# Patient Record
Sex: Female | Born: 1950
Health system: Southern US, Community
[De-identification: ages and names within clinical notes are randomized; demographics above are authoritative.]

## PROBLEM LIST (undated history)

## (undated) DIAGNOSIS — C801 Malignant (primary) neoplasm, unspecified: Secondary | ICD-10-CM

## (undated) DIAGNOSIS — E782 Mixed hyperlipidemia: Secondary | ICD-10-CM

## (undated) HISTORY — DX: Malignant (primary) neoplasm, unspecified: C80.1

## (undated) HISTORY — DX: Mixed hyperlipidemia: E78.2

## (undated) HISTORY — PX: TONSILLECTOMY: SUR1361

---

## 2008-10-29 DIAGNOSIS — C801 Malignant (primary) neoplasm, unspecified: Secondary | ICD-10-CM

## 2008-10-29 HISTORY — PX: BREAST LUMPECTOMY: SHX2

## 2008-10-29 HISTORY — DX: Malignant (primary) neoplasm, unspecified: C80.1

## 2011-02-26 ENCOUNTER — Emergency Department: Payer: Self-pay | Admitting: Emergency Medicine

## 2011-04-03 ENCOUNTER — Ambulatory Visit: Payer: Self-pay | Admitting: Gastroenterology

## 2011-04-05 LAB — PATHOLOGY REPORT

## 2011-10-30 HISTORY — PX: COLONOSCOPY: SHX174

## 2011-10-30 LAB — HM COLONOSCOPY

## 2011-12-11 IMAGING — US ABDOMEN ULTRASOUND LIMITED
1 series · 17 of 25 positions shown · non-contrast
Comparison: none

REASON FOR EXAM: RUQ/epigastric pain and vomiting
COMMENTS:

PROCEDURE:     US  - US ABDOMEN LIMITED SURVEY  - February 27, 2011  [DATE]
RESULT:

[Series 1: abdomen ultrasound limited · 17 of 50 slices shown]
[im 1/50]
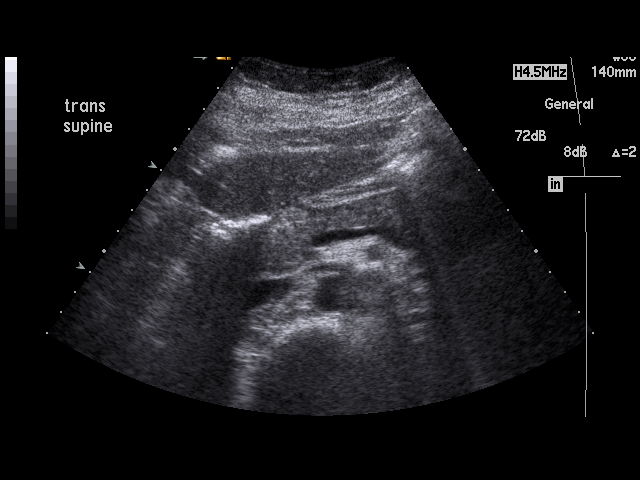
[im 5/50]
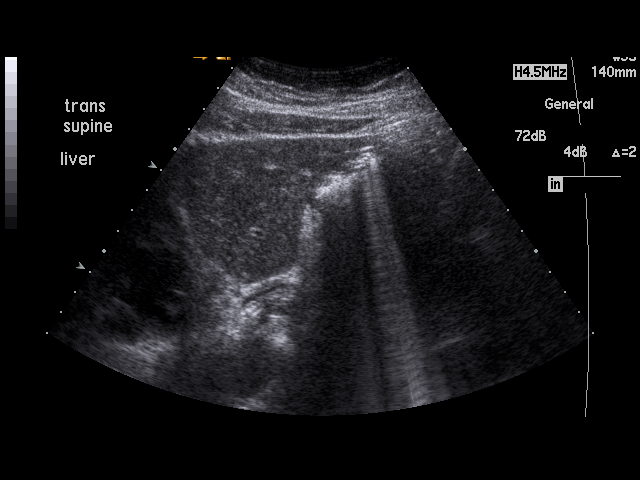
[im 7/50]
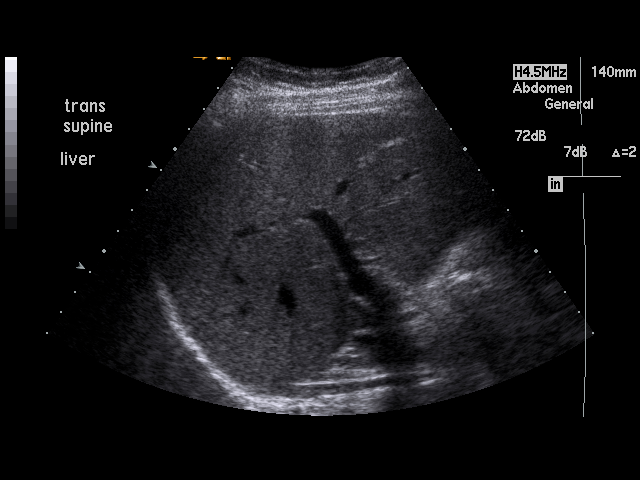
[im 11/50]
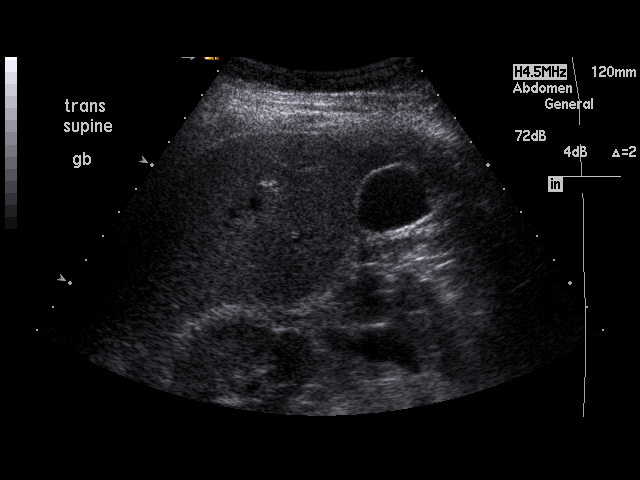
[im 13/50]
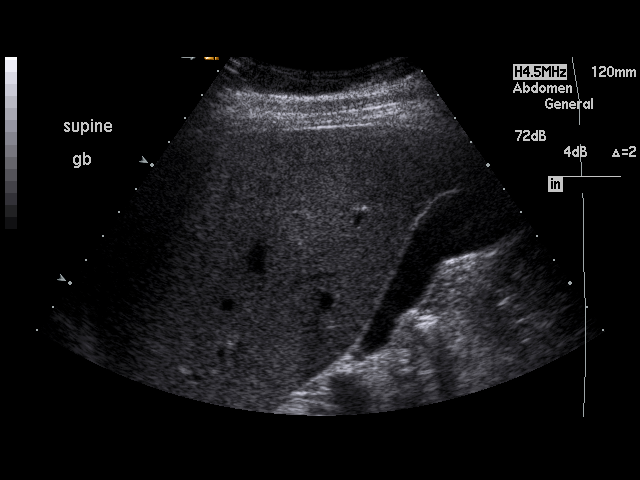
[im 17/50]
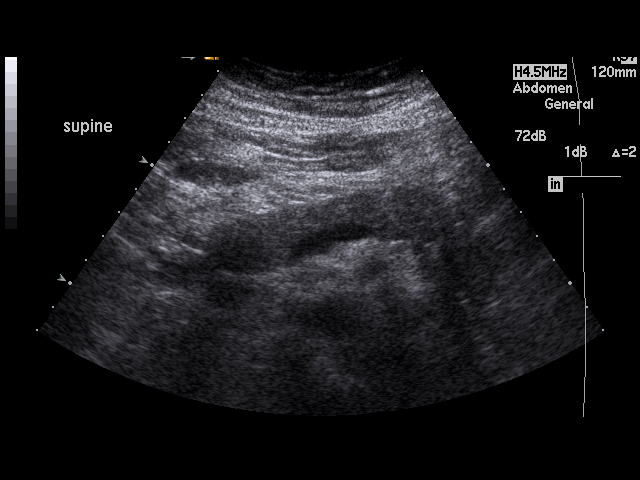
[im 19/50]
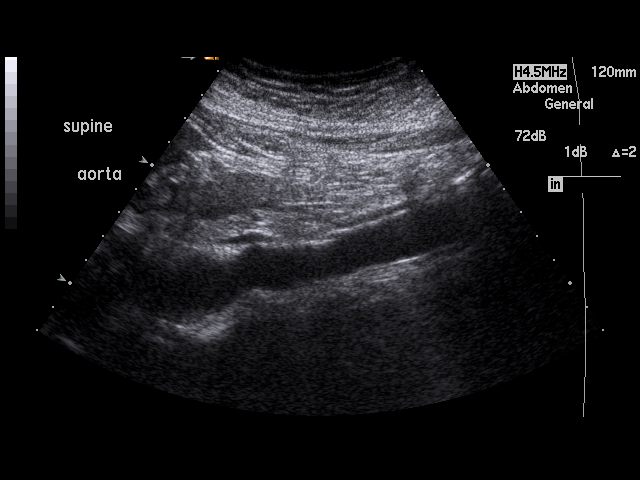
[im 23/50]
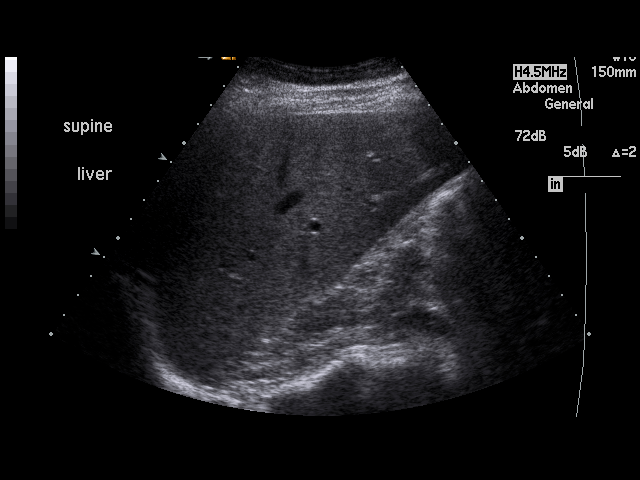
[im 25/50]
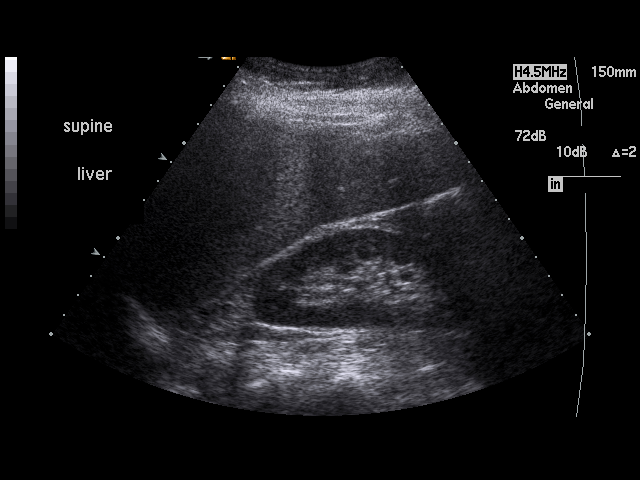
[im 27/50]
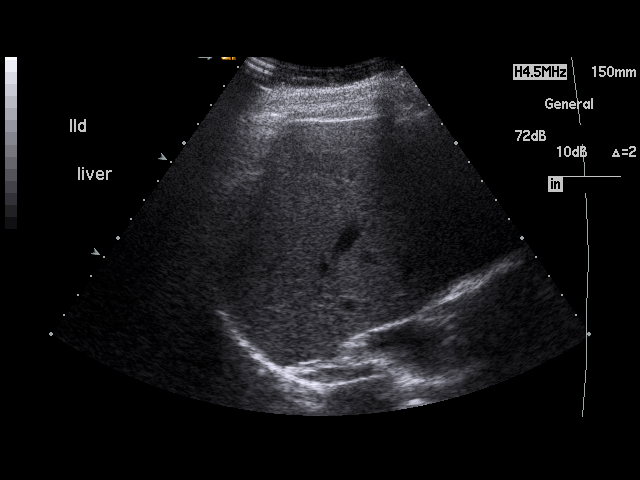
[im 31/50]
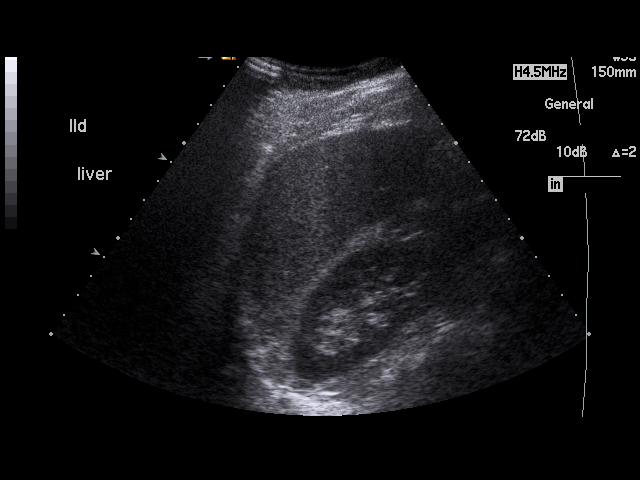
[im 33/50]
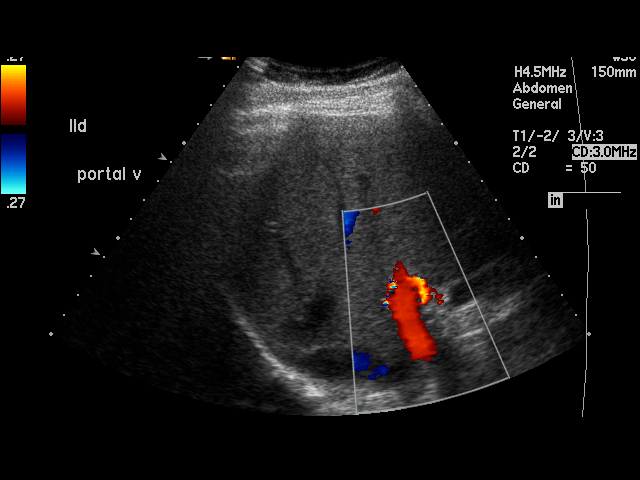
[im 37/50]
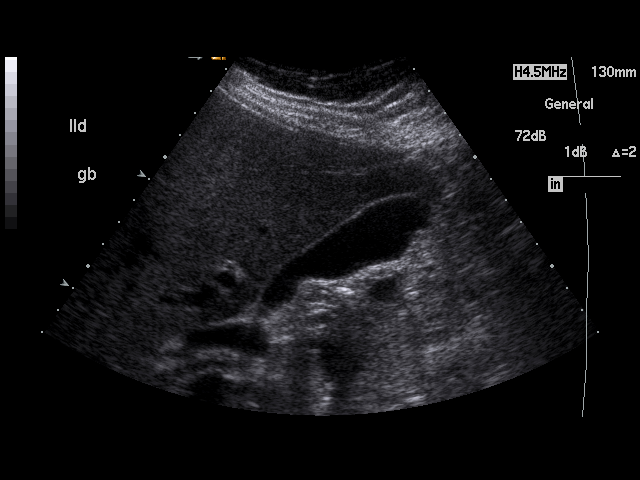
[im 39/50]
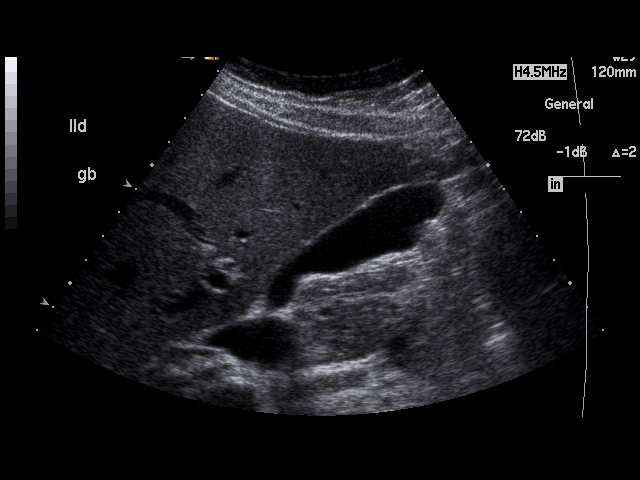
[im 43/50]
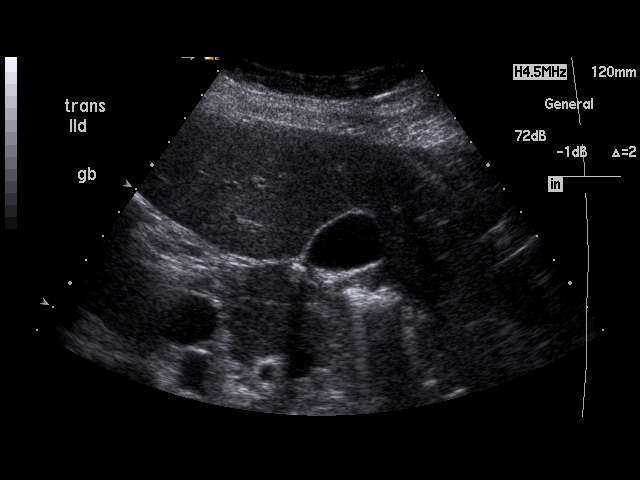
[im 45/50]
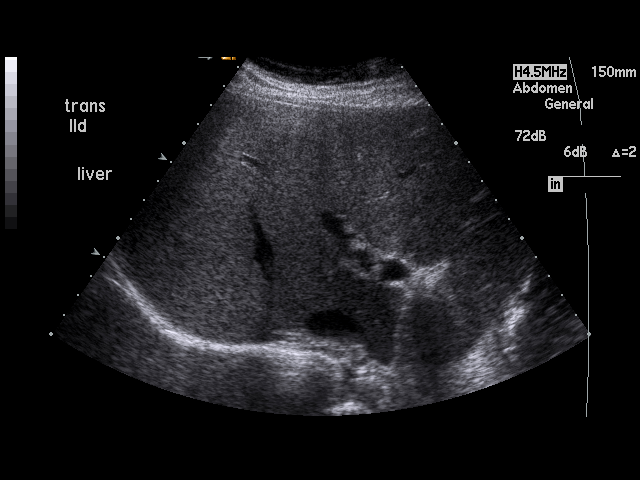
[im 50/50]
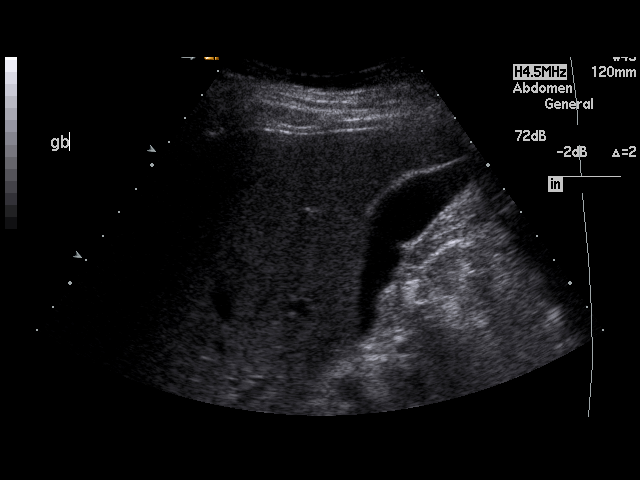

[17 of 25 positions shown; findings below may reference images not displayed]

FINDINGS: The visualized portions of the liver demonstrate a homogeneous
echotexture. The gallbladder fossa demonstrates no evidence of
pericholecystic fluid, gallstones or sludging. Gallbladder wall thickness is
1.9 mm and the common bile duct 4.1 mm in diameter. There is no evidence of
a sonographic Murphy's sign. There is no evidence of intrahepatic or
extrahepatic biliary duct dilatation or gallbladder wall thickening. Limited
evaluation of the pancreas, head of the pancreas is unremarkable.

Hepatopetal flow is identified within the portal vein. There is no evidence
of ascites.
IMPRESSION: 1. Unremarkable limited right upper quadrant ultrasound.

Dr. Nomasibulele of the Emergency Department was informed of these findings via a
preliminary faxed report.

## 2012-02-18 ENCOUNTER — Other Ambulatory Visit: Payer: Self-pay | Admitting: Family Medicine

## 2014-10-07 LAB — HM PAP SMEAR: HM Pap smear: NEGATIVE

## 2014-11-23 LAB — HM HEPATITIS C SCREENING LAB: HM Hepatitis Screen: NEGATIVE

## 2016-10-08 LAB — HM MAMMOGRAPHY

## 2016-12-12 ENCOUNTER — Ambulatory Visit (INDEPENDENT_AMBULATORY_CARE_PROVIDER_SITE_OTHER): Payer: Self-pay

## 2016-12-12 ENCOUNTER — Ambulatory Visit
Admission: EM | Admit: 2016-12-12 | Discharge: 2016-12-12 | Disposition: A | Discharge status: Home / Self Care | Payer: Self-pay | Attending: Family Medicine | Admitting: Family Medicine

## 2016-12-12 DIAGNOSIS — R55 Syncope and collapse: Secondary | ICD-10-CM

## 2016-12-12 DIAGNOSIS — R69 Illness, unspecified: Secondary | ICD-10-CM

## 2016-12-12 DIAGNOSIS — Z79899 Other long term (current) drug therapy: Secondary | ICD-10-CM | POA: Insufficient documentation

## 2016-12-12 DIAGNOSIS — R42 Dizziness and giddiness: Secondary | ICD-10-CM | POA: Insufficient documentation

## 2016-12-12 DIAGNOSIS — J111 Influenza due to unidentified influenza virus with other respiratory manifestations: Secondary | ICD-10-CM | POA: Insufficient documentation

## 2016-12-12 LAB — BASIC METABOLIC PANEL
ANION GAP: 8 (ref 5–15)
BUN: 8 mg/dL (ref 6–20)
CHLORIDE: 98 mmol/L — AB (ref 101–111)
CO2: 26 mmol/L (ref 22–32)
CREATININE: 0.74 mg/dL (ref 0.44–1.00)
Calcium: 8.9 mg/dL (ref 8.9–10.3)
GFR calc non Af Amer: >60 mL/min (ref >60)
Glucose, Bld: 113 mg/dL — ABNORMAL HIGH (ref 65–99)
POTASSIUM: 3.3 mmol/L — AB (ref 3.5–5.1)
SODIUM: 132 mmol/L — AB (ref 135–145)

## 2016-12-12 LAB — URINALYSIS, COMPLETE (UACMP) WITH MICROSCOPIC
BILIRUBIN URINE: NEGATIVE
Bacteria, UA: NONE SEEN
Glucose, UA: NEGATIVE mg/dL
HGB URINE DIPSTICK: NEGATIVE
Ketones, ur: NEGATIVE mg/dL
LEUKOCYTES UA: NEGATIVE
NITRITE: NEGATIVE
Protein, ur: NEGATIVE mg/dL
pH: 5.5 (ref 5.0–8.0)

## 2016-12-12 LAB — CBC WITH DIFFERENTIAL/PLATELET
BASOS ABS: 0 10*3/uL (ref 0–0.1)
BASOS PCT: 1 %
EOS ABS: 0 10*3/uL (ref 0–0.7)
Eosinophils Relative: 0 %
HEMATOCRIT: 41.3 % (ref 35.0–47.0)
HEMOGLOBIN: 13.9 g/dL (ref 12.0–16.0)
Lymphocytes Relative: 26 %
Lymphs Abs: 0.8 10*3/uL — ABNORMAL LOW (ref 1.0–3.6)
MCH: 28.8 pg (ref 26.0–34.0)
MCHC: 33.6 g/dL (ref 32.0–36.0)
MCV: 85.8 fL (ref 80.0–100.0)
Monocytes Absolute: 0.2 10*3/uL (ref 0.2–0.9)
Monocytes Relative: 7 %
NEUTROS PCT: 66 %
Neutro Abs: 2 10*3/uL (ref 1.4–6.5)
Platelets: 223 10*3/uL (ref 150–440)
RBC: 4.82 MIL/uL (ref 3.80–5.20)
RDW: 12.7 % (ref 11.5–14.5)
WBC: 3.1 10*3/uL — AB (ref 3.6–11.0)

## 2016-12-12 LAB — MAGNESIUM: MAGNESIUM: 2.1 mg/dL (ref 1.7–2.4)

## 2016-12-12 MED ORDER — BENZONATATE 100 MG PO CAPS: 100.0000 mg | ORAL_CAPSULE | Freq: Three times a day (TID) | ORAL | 0 refills | Status: DC | PRN

## 2016-12-12 MED ORDER — AZITHROMYCIN 250 MG PO TABS: ORAL_TABLET | ORAL | 0 refills | Status: DC

## 2016-12-12 MED ORDER — POTASSIUM CHLORIDE CRYS ER 20 MEQ PO TBCR
20.0000 meq | EXTENDED_RELEASE_TABLET | Freq: Once | ORAL | Status: AC
  Administered 2016-12-12: 20 meq via ORAL

## 2016-12-12 NOTE — Discharge Instructions (Signed)
Take medication as prescribed. Rest. Drink plenty of fluids. Eat well.   Follow up with your primary care physician this week. Return to Urgent care or Emergency room for continued dizziness, chest pain, shortness of breath, new or worsening concerns.

## 2016-12-12 NOTE — ED Provider Notes (Signed)
[facility] ____________________________________________  Time seen: Approximately 1:13 PM  I have reviewed the triage vital signs and the nursing notes.   HISTORY  Chief Complaint Dizziness   HPI Phyllis Knox is a 66 y.o. female presenting for the complaint of dizzy episode prior to arrival. Patient reports in the last 4-5 days she has had runny nose, nasal congestion and cough that were quick in onset with accompanying fever. Reports fever of up to 101 at home intermittently. Reports has been taking over-the-counter Mucinex and Tylenol intermittently, occasional sudafed Patient reports a lot of chest congestion sensation and frequently coughing.   States cough is primarily a hacking cough, occasionally able to produce mucus. Reports some nasal drainage but reports sensation of congestion in both ears with ears feeling they have fluid present. Patient reports history of mild dizziness when sensation of ear fluid was present in past. Patient reports she had stayed home from work earlier this week, but felt better yesterday morning so she went to work. Patient states that she felt like she over did herself yesterday, as she came home last night she felt more tired and then she felt her fever was back. Patient reports when getting up last night she had a brief episode described as lightheadedness that last for a few seconds and resolved. Patient reports this morning she felt okay and was going into work and but as she was walking and she felt lightheaded, with bilateral arm and leg weakness sensation that quickly resolved. Patient denies any current dizziness, extremity weakness or paresthesias.   Denies fall or syncope. Denies room spinning sensation. Denies tinnitus or hearing changes. States occasional scratchy throat, denies current sore throat. Reports has not been eating and drinking as much as normal. Denies dysuria. Denies abdominal pain. Denies known sick  contacts.  Denies chest pain, chest pain with deep breath, shortness of breath, vision changes, abdominal pain, dysuria, extremity pain, extremity swelling or rash. Denies recent sickness. Denies recent antibiotic use.   Denies chronic medical history.   History reviewed. No pertinent past medical history.  There are no active problems to display for this patient.   Past Surgical History:  Procedure Laterality Date  . BREAST LUMPECTOMY Right 2010     No current facility-administered medications for this encounter.   Current Outpatient Prescriptions:  .  Oil of Oregano 1500 MG CAPS, Take by mouth., Disp: , Rfl:  .  Omega-3 Fatty Acids (FISH OIL) 1000 MG CPDR, Take by mouth., Disp: , Rfl:  .  Turmeric 500 MG TABS, Take by mouth., Disp: , Rfl:  .  azithromycin (ZITHROMAX Z-PAK) 250 MG tablet, Take 2 tablets (500 mg) on  Day 1,  followed by 1 tablet (250 mg) once daily on Days 2 through 5., Disp: 6 each, Rfl: 0 .  benzonatate (TESSALON PERLES) 100 MG capsule, Take 1 capsule (100 mg total) by mouth 3 (three) times daily as needed for cough., Disp: 15 capsule, Rfl: 0  Allergies Patient has no known allergies.  family history. Brother: Healthy Father: Prostate cancer Mother: Breast cancer  Social History Social History  Substance Use Topics  . Smoking status: Never Smoker  . Smokeless tobacco: Never Used  . Alcohol use Yes     Comment: rarely    Review of Systems Constitutional: As above.  Eyes: No visual changes. ENT: As above. Cardiovascular: Denies chest pain. Respiratory: Denies shortness of breath. Denies hemoptysis. Gastrointestinal: No abdominal pain.  No nausea, no vomiting. States occasional diarrhea over  last few days, none today.  No constipation. Denies melena, hematochezia or abnormal colored stools. Genitourinary: Negative for dysuria. Musculoskeletal: Negative for back pain. Skin: Negative for rash. Neurological: Negative for focal weakness or  numbness.  10-point ROS otherwise negative.  ____________________________________________   PHYSICAL EXAM:  VITAL SIGNS: ED Triage Vitals  Enc Vitals Group     BP 12/12/16 1235 140/83     Pulse Rate 12/12/16 1235 72     Resp 12/12/16 1235 17     Temp 12/12/16 1235 99.2 F (37.3 C)     Temp Source 12/12/16 1235 Oral     SpO2 12/12/16 1235 100 %     Weight 12/12/16 1235 166 lb (75.3 kg)     Height 12/12/16 1235 5\' 8"  (1.727 m)     Head Circumference --      Peak Flow --      Pain Score 12/12/16 1238 0     Pain Loc --      Pain Edu? --      Excl. in [place]? --    Orthostatic VS for the past 24 hrs:  BP- Lying Pulse- Lying BP- Sitting Pulse- Sitting BP- Standing at 0 minutes Pulse- Standing at 0 minutes  12/12/16 1252 125/69 67 97/79 74 104/82 86      Constitutional: Alert and oriented. Well appearing and in no acute distress. Eyes: Conjunctivae are normal. PERRL. EOMI. Head: Atraumatic. No sinus tenderness to palpation. No swelling. No erythema.  Ears: no erythema, normal TMs bilaterally.   Nose:Nasal congestion with clear rhinorrhea  Mouth/Throat: Mucous membranes are moist. No pharyngeal erythema. No tonsillar swelling or exudate.  Neck: No stridor.  No cervical spine tenderness to palpation. Hematological/Lymphatic/Immunilogical: No cervical lymphadenopathy. Cardiovascular: Normal rate, regular rhythm. Grossly normal heart sounds.  Good peripheral circulation. Respiratory: Normal respiratory effort.  No retractions. No wheezes. Mild scattered rhonchi. Speaks in complete sentences. Good air movement. Dry intermittent cough noted in room. Gastrointestinal: Soft and nontender. Normal Bowel sounds. No CVA tenderness. Musculoskeletal: Ambulatory with steady gait. No cervical, thoracic or lumbar tenderness to palpation. Bilateral lower extremities nontender to palpation and no edema noted. Neurologic:  Normal speech and language. No gait instability. No focal neurological  deficits. Negative Romberg. Normal finger to nose. Normal heel to shin bilaterally. 5 out of 5 strength to bilateral upper and lower extremities. Steady gait. Skin:  Skin appears warm, dry and intact. No rash noted. Psychiatric: Mood and affect are normal. Speech and behavior are normal. ___________________________________________   LABS (all labs ordered are listed, but only abnormal results are displayed)  Labs Reviewed  CBC WITH DIFFERENTIAL/PLATELET - Abnormal; Notable for the following:       Result Value   WBC 3.1 (*)    Lymphs Abs 0.8 (*)    All other components within normal limits  BASIC METABOLIC PANEL - Abnormal; Notable for the following:    Sodium 132 (*)    Potassium 3.3 (*)    Chloride 98 (*)    Glucose, Bld 113 (*)    All other components within normal limits  URINALYSIS, COMPLETE (UACMP) WITH MICROSCOPIC - Abnormal; Notable for the following:    Color, Urine STRAW (*)    Specific Gravity, Urine <1.005 (*)    Squamous Epithelial / LPF 0-5 (*)    All other components within normal limits  MAGNESIUM   ____________________________________________  EKG  ED ECG REPORT I, Marylene Land, the attending provider, personally viewed and interpreted this ECG.   Date: 12/12/2016  EKG Time: 1249  Rate: 69  Rhythm:  normal sinus rhythm, no previous ecgs visible  Axis: normal  Intervals:none  ST&T Change: No ST or T-wave depression or elevation noted.  ____________________________________________  RADIOLOGY  Dg Chest 2 View  Result Date: 12/12/2016 CLINICAL DATA:  Productive cough for 5 days.  Fever, congestion EXAM: CHEST  2 VIEW COMPARISON:  None. FINDINGS: Heart and mediastinal contours are within normal limits. No focal opacities or effusions. No acute bony abnormality. IMPRESSION: No active cardiopulmonary disease. Electronically Signed   By: Rolm Baptise M.D.   On: 12/12/2016 13:38   ____________________________________________   PROCEDURES Procedures     INITIAL IMPRESSION / ASSESSMENT AND PLAN / ED COURSE  Pertinent labs & imaging results that were available during my care of the patient were reviewed by me and considered in my medical decision making (see chart for details).  Well-appearing patient. No acute distress. Patient laughing and smiling during exam. Patient changes positions quickly in room without report of dizziness or sensation of near syncope. Chest x-ray negative per radiologist, no acute cardiopulmonary disease. EKG normal sinus. Labs reviewed and discussed with patient. Discussed lower WBC count. Discussed slightly lower potassium, 20 mEq potassium given once in urgent care. Discussed with patient orthostatic vitals showing patient orthostatic. Patient states she did not drink any fluids much today. Drink and fluids in urgent care well. Suspect patient with recent influenza, and discuss concerns of secondary infection and mild dehydration. Patient reports feeling better at this time. Denies dizziness. Will treat patient with oral azithromycin, when necessary Tessalon Perles. Encouraged rest, fluids and close PCP follow-up regarding laboratory studies and today's visit. Discussed strict follow-up and return parameters including up to proceeding to emergency room for any continued or worsening symptoms, chest pain, shortness of breath or other concerns.Discussed indication, risks and benefits of medications with patient.  Discussed follow up with Primary care physician this week. Discussed follow up and return parameters including no resolution or any worsening concerns. Patient verbalized understanding and agreed to plan.   ____________________________________________   FINAL CLINICAL IMPRESSION(S) / ED DIAGNOSES  Final diagnoses:  Influenza-like illness  Dizziness     Discharge Medication List as of 12/12/2016  2:26 PM    START taking these medications   Details  azithromycin (ZITHROMAX Z-PAK) 250 MG tablet Take 2  tablets (500 mg) on  Day 1,  followed by 1 tablet (250 mg) once daily on Days 2 through 5., Normal    benzonatate (TESSALON PERLES) 100 MG capsule Take 1 capsule (100 mg total) by mouth 3 (three) times daily as needed for cough., Starting Wed 12/12/2016, Normal        Note: This dictation was prepared with Dragon dictation along with smaller phrase technology. Any transcriptional errors that result from this process are unintentional.         Marylene Land, NP 12/12/16 1558

## 2016-12-12 NOTE — ED Triage Notes (Signed)
Patient complains of dizziness that started last night before she went to bed. Patient reports that she is recently getting over the flu. Reports that she went to work this morning and felt like she was going to pass out but noticed bilateral arm tingling. Patient also reports that she still has a cough and congestion.

## 2017-11-29 ENCOUNTER — Other Ambulatory Visit: Payer: Self-pay | Admitting: Internal Medicine

## 2017-12-02 ENCOUNTER — Ambulatory Visit (INDEPENDENT_AMBULATORY_CARE_PROVIDER_SITE_OTHER): Payer: PPO | Admitting: Internal Medicine

## 2017-12-02 ENCOUNTER — Encounter: Payer: Self-pay | Admitting: Internal Medicine

## 2017-12-02 DIAGNOSIS — Z853 Personal history of malignant neoplasm of breast: Secondary | ICD-10-CM | POA: Diagnosis not present

## 2017-12-02 DIAGNOSIS — E079 Disorder of thyroid, unspecified: Secondary | ICD-10-CM | POA: Insufficient documentation

## 2017-12-02 DIAGNOSIS — R7303 Prediabetes: Secondary | ICD-10-CM | POA: Insufficient documentation

## 2017-12-02 NOTE — Progress Notes (Signed)
Date:  12/02/2017   Name:  Phyllis Knox   DOB:  1951-05-21   MRN:  [id number]   Chief Complaint: Establish Care (Needs mammogram. Goes to [facility] department. )  History of breast cancer - had cancer in 2010, lumpectomy then XRT.  She did not take tamoxifen - did participate in a study of follow up.  She report no current issues.  She is overdue for her annual mammogram at [facility].   Review of Systems  Constitutional: Negative for chills, fatigue and fever.  HENT: Negative for hearing loss, sinus pressure, sinus pain and trouble swallowing.   Eyes: Negative for visual disturbance.  Respiratory: Negative for chest tightness, shortness of breath and wheezing.   Cardiovascular: Negative for chest pain, palpitations and leg swelling.  Gastrointestinal: Negative for abdominal pain, constipation and diarrhea.  Genitourinary: Negative for difficulty urinating and dysuria.  Musculoskeletal: Negative for arthralgias.  Allergic/Immunologic: Negative for environmental allergies.  Neurological: Negative for dizziness, numbness and headaches.  Psychiatric/Behavioral: Negative for dysphoric mood and sleep disturbance. The patient is not nervous/anxious.     Patient Active Problem List   Diagnosis Date Noted  . History of cancer of right breast 12/02/2017    Prior to Admission medications   Medication Sig Start Date End Date Taking? Authorizing Provider  Ascorbic Acid (VITAMIN C) 100 MG tablet Take 100 mg by mouth daily.   Yes [provider]  ferrous sulfate 325 (65 FE) MG EC tablet Take 325 mg by mouth 3 (three) times daily with meals.   Yes [provider]  thiamine (VITAMIN B-1) 100 MG tablet Take 100 mg by mouth daily.   Yes [provider]  Turmeric 500 MG TABS Take by mouth.   Yes [provider]    No Known Allergies  Past Surgical History:  Procedure Laterality Date  . BREAST LUMPECTOMY Right 2010  . COLONOSCOPY  10/2011   Normal  .  TONSILLECTOMY      Social History   Tobacco Use  . Smoking status: Never Smoker  . Smokeless tobacco: Never Used  Substance Use Topics  . Alcohol use: No    Frequency: Never  . Drug use: No     Medication list has been reviewed and updated.  PHQ 2/9 Scores 12/02/2017  PHQ - 2 Score 0    Physical Exam  Constitutional: She is oriented to person, place, and time. She appears well-developed. No distress.  HENT:  Head: Normocephalic and atraumatic.  Neck: Normal range of motion. Neck supple. Carotid bruit is not present. No thyroid mass and no thyromegaly present.  Cardiovascular: Normal rate, regular rhythm and normal heart sounds.  Pulmonary/Chest: Effort normal and breath sounds normal. No respiratory distress. She has no wheezes.  Abdominal: Soft. Normal appearance and bowel sounds are normal. There is no tenderness.  Musculoskeletal: Normal range of motion.       Right knee: She exhibits normal range of motion and no swelling.       Left knee: She exhibits normal range of motion and no swelling.  Neurological: She is alert and oriented to person, place, and time. She has normal strength. No sensory deficit.  Skin: Skin is warm and dry. No rash noted.  Psychiatric: She has a normal mood and affect. Her speech is normal and behavior is normal. Thought content normal.  Nursing note and vitals reviewed.   BP 130/82   Pulse 75   Ht 5\' 8"  (1.727 m)   Wt 186  lb (84.4 kg)   SpO2 96%   BMI 28.28 kg/m   Assessment and Plan: 1. History of cancer of right breast Refer to [facility] - [facility]   No orders of the defined types were placed in this encounter. Pt will schedule MAW and CPX in the next few months  Partially dictated using Dragon software. Any errors are unintentional.  Halina Maidens, MD [facility]  12/02/2017

## 2017-12-02 NOTE — Patient Instructions (Addendum)
Phyllis Knox is the first pneumonia shot you should get.  Not the PPV-23 (that is given at age 65)

## 2017-12-17 DIAGNOSIS — Z853 Personal history of malignant neoplasm of breast: Secondary | ICD-10-CM | POA: Diagnosis not present

## 2017-12-17 DIAGNOSIS — Z08 Encounter for follow-up examination after completed treatment for malignant neoplasm: Secondary | ICD-10-CM | POA: Diagnosis not present

## 2017-12-18 ENCOUNTER — Ambulatory Visit (INDEPENDENT_AMBULATORY_CARE_PROVIDER_SITE_OTHER): Payer: PPO

## 2017-12-18 VITALS — BP 110/68 | HR 75 | Temp 98.5°F | Resp 12 | Ht 68.0 in | Wt 184.0 lb

## 2017-12-18 DIAGNOSIS — Z0000 Encounter for general adult medical examination without abnormal findings: Secondary | ICD-10-CM

## 2017-12-18 DIAGNOSIS — Z23 Encounter for immunization: Secondary | ICD-10-CM | POA: Diagnosis not present

## 2017-12-18 NOTE — Patient Instructions (Signed)
[name] , Thank you for taking time to come for your Medicare Wellness Visit. I appreciate your ongoing commitment to your health goals. Please review the following plan we discussed and let me know if I can assist you in the future.   Screening recommendations/referrals: Colorectal Screening: Completed colonoscopy 10/30/11. Repeat every 10 years. Mammogram: Completed 12/17/17. Repeat every year Bone Density: Declined  Vision/Dental Exams: Recommended yearly ophthalmology/optometry visit for glaucoma screening and checkup Recommended yearly dental visit for hygiene and checkup  Vaccinations: Influenza vaccine: Declined Pneumococcal vaccine: PCV13 given today Tdap vaccine: Up to date Shingles vaccine: Please call your insurance company to determine your out of pocket expense for the Shingrix vaccine. You may also receive this vaccine at your local pharmacy or Health Dept.   Advanced directives: Please bring a copy of your POA (Power of Attorney) and/or Living Will to your next appointment.  Conditions/risks identified: Recommend to drink at least 6-8 8oz glasses of water per day.  Next appointment: You are scheduled to see Dr. Army Melia on 05/06/18 @ 8:30am.   Please schedule your Annual Wellness Visit with your Nurse Health Advisor in one year.  Preventive Care 65 Years and Older, Female Preventive care refers to lifestyle choices and visits with your health care provider that can promote health and wellness. What does preventive care include?  A yearly physical exam. This is also called an annual well check.  Dental exams once or twice a year.  Routine eye exams. Ask your health care provider how often you should have your eyes checked.  Personal lifestyle choices, including:  Daily care of your teeth and gums.  Regular physical activity.  Eating a healthy diet.  Avoiding tobacco and drug use.  Limiting alcohol use.  Practicing safe sex.  Taking low-dose aspirin  every day.  Taking vitamin and mineral supplements as recommended by your health care provider. What happens during an annual well check? The services and screenings done by your health care provider during your annual well check will depend on your age, overall health, lifestyle risk factors, and family history of disease. Counseling  Your health care provider may ask you questions about your:  Alcohol use.  Tobacco use.  Drug use.  Emotional well-being.  Home and relationship well-being.  Sexual activity.  Eating habits.  History of falls.  Memory and ability to understand (cognition).  Work and work Statistician.  Reproductive health. Screening  You may have the following tests or measurements:  Height, weight, and BMI.  Blood pressure.  Lipid and cholesterol levels. These may be checked every 5 years, or more frequently if you are over 65 years old.  Skin check.  Lung cancer screening. You may have this screening every year starting at age 65 if you have a 30-pack-year history of smoking and currently smoke or have quit within the past 15 years.  Fecal occult blood test (FOBT) of the stool. You may have this test every year starting at age 65.  Flexible sigmoidoscopy or colonoscopy. You may have a sigmoidoscopy every 5 years or a colonoscopy every 10 years starting at age 65.  Hepatitis C blood test.  Hepatitis B blood test.  Sexually transmitted disease (STD) testing.  Diabetes screening. This is done by checking your blood sugar (glucose) after you have not eaten for a while (fasting). You may have this done every 1-3 years.  Bone density scan. This is done to screen for osteoporosis. You may have this done starting at age 65.  Mammogram. This may be done every 1-2 years. Talk to your health care provider about how often you should have regular mammograms. Talk with your health care provider about your test results, treatment options, and if necessary,  the need for more tests. Vaccines  Your health care provider may recommend certain vaccines, such as:  Influenza vaccine. This is recommended every year.  Tetanus, diphtheria, and acellular pertussis (Tdap, Td) vaccine. You may need a Td booster every 10 years.  Zoster vaccine. You may need this after age 65.  Pneumococcal 13-valent conjugate (PCV13) vaccine. One dose is recommended after age 65.  Pneumococcal polysaccharide (PPSV23) vaccine. One dose is recommended after age 65. Talk to your health care provider about which screenings and vaccines you need and how often you need them. This information is not intended to replace advice given to you by your health care provider. Make sure you discuss any questions you have with your health care provider. Document Released: 11/11/2015 Document Revised: 07/04/2016 Document Reviewed: 08/16/2015 Elsevier Interactive Patient Education  2017 [place] Prevention in the Home Falls can cause injuries. They can happen to people of all ages. There are many things you can do to make your home safe and to help prevent falls. What can I do on the outside of my home?  Regularly fix the edges of walkways and driveways and fix any cracks.  Remove anything that might make you trip as you walk through a door, such as a raised step or threshold.  Trim any bushes or trees on the path to your home.  Use bright outdoor lighting.  Clear any walking paths of anything that might make someone trip, such as rocks or tools.  Regularly check to see if handrails are loose or broken. Make sure that both sides of any steps have handrails.  Any raised decks and porches should have guardrails on the edges.  Have any leaves, snow, or ice cleared regularly.  Use sand or salt on walking paths during winter.  Clean up any spills in your garage right away. This includes oil or grease spills. What can I do in the bathroom?  Use night lights.  Install  grab bars by the toilet and in the tub and shower. Do not use towel bars as grab bars.  Use non-skid mats or decals in the tub or shower.  If you need to sit down in the shower, use a plastic, non-slip stool.  Keep the floor dry. Clean up any water that spills on the floor as soon as it happens.  Remove soap buildup in the tub or shower regularly.  Attach bath mats securely with double-sided non-slip rug tape.  Do not have throw rugs and other things on the floor that can make you trip. What can I do in the bedroom?  Use night lights.  Make sure that you have a light by your bed that is easy to reach.  Do not use any sheets or blankets that are too big for your bed. They should not hang down onto the floor.  Have a firm chair that has side arms. You can use this for support while you get dressed.  Do not have throw rugs and other things on the floor that can make you trip. What can I do in the kitchen?  Clean up any spills right away.  Avoid walking on wet floors.  Keep items that you use a lot in easy-to-reach places.  If you need to reach  something above you, use a strong step stool that has a grab bar.  Keep electrical cords out of the way.  Do not use floor polish or wax that makes floors slippery. If you must use wax, use non-skid floor wax.  Do not have throw rugs and other things on the floor that can make you trip. What can I do with my stairs?  Do not leave any items on the stairs.  Make sure that there are handrails on both sides of the stairs and use them. Fix handrails that are broken or loose. Make sure that handrails are as long as the stairways.  Check any carpeting to make sure that it is firmly attached to the stairs. Fix any carpet that is loose or worn.  Avoid having throw rugs at the top or bottom of the stairs. If you do have throw rugs, attach them to the floor with carpet tape.  Make sure that you have a light switch at the top of the stairs and  the bottom of the stairs. If you do not have them, ask someone to add them for you. What else can I do to help prevent falls?  Wear shoes that:  Do not have high heels.  Have rubber bottoms.  Are comfortable and fit you well.  Are closed at the toe. Do not wear sandals.  If you use a stepladder:  Make sure that it is fully opened. Do not climb a closed stepladder.  Make sure that both sides of the stepladder are locked into place.  Ask someone to hold it for you, if possible.  Clearly mark and make sure that you can see:  Any grab bars or handrails.  First and last steps.  Where the edge of each step is.  Use tools that help you move around (mobility aids) if they are needed. These include:  Canes.  Walkers.  Scooters.  Crutches.  Turn on the lights when you go into a dark area. Replace any light bulbs as soon as they burn out.  Set up your furniture so you have a clear path. Avoid moving your furniture around.  If any of your floors are uneven, fix them.  If there are any pets around you, be aware of where they are.  Review your medicines with your doctor. Some medicines can make you feel dizzy. This can increase your chance of falling. Ask your doctor what other things that you can do to help prevent falls. This information is not intended to replace advice given to you by your health care provider. Make sure you discuss any questions you have with your health care provider. Document Released: 08/11/2009 Document Revised: 03/22/2016 Document Reviewed: 11/19/2014 Elsevier Interactive Patient Education  2017 [employer].

## 2017-12-18 NOTE — Progress Notes (Signed)
Subjective:   Phyllis Knox is a 67 y.o. female who presents for Medicare Annual (Subsequent) preventive examination.  Review of Systems:  N/A Cardiac Risk Factors include: advanced age (>66men, >10 women)     Objective:     Vitals: BP 110/68 (BP Location: Right Arm, Patient Position: Sitting, Cuff Size: Normal)   Pulse 75   Temp 98.5 F (36.9 C) (Oral)   Resp 12   Ht 5\' 8"  (1.727 m)   Wt 184 lb (83.5 kg)   BMI 27.98 kg/m   Body mass index is 27.98 kg/m.  Advanced Directives 12/18/2017  Does Patient Have a Medical Advance Directive? Yes  Type of Paramedic of [place];Living will  Copy of [place] in Chart? No - copy requested    Tobacco Social History   Tobacco Use  Smoking Status Never Smoker  Smokeless Tobacco Never Used  Tobacco Comment   smoking cessation materials not required     Counseling given: No Comment: smoking cessation materials not required   Clinical Intake:  Pre-visit preparation completed: Yes  Pain : No/denies pain   BMI - recorded: 27.98 Nutritional Status: BMI 25 -29 Overweight Nutritional Risks: None Diabetes: No  How often do you need to have someone help you when you read instructions, pamphlets, or other written materials from your doctor or pharmacy?: 1 - Never  Interpreter Needed?: No  Information entered by :: AEversole, LPN  Past Medical History:  Diagnosis Date  . Cancer [facility]) 2010   Breast Cancer   Past Surgical History:  Procedure Laterality Date  . BREAST LUMPECTOMY Right 2010  . COLONOSCOPY  10/2011   Normal  . TONSILLECTOMY     Family History  Problem Relation Age of Onset  . Breast cancer Mother   . Prostate cancer Father    Social History   Socioeconomic History  . Marital status: Married    Spouse name: None  . Number of children: 3  . Years of education: some college  . Highest education level: 12th grade  Social Needs  . Financial resource  strain: Not hard at all  . Food insecurity - worry: Never true  . Food insecurity - inability: Never true  . Transportation needs - medical: No  . Transportation needs - non-medical: No  Occupational History    Employer: [employer]  Tobacco Use  . Smoking status: Never Smoker  . Smokeless tobacco: Never Used  . Tobacco comment: smoking cessation materials not required  Substance and Sexual Activity  . Alcohol use: No    Frequency: Never  . Drug use: No  . Sexual activity: Yes  Other Topics Concern  . None  Social History Narrative  . None    Outpatient Encounter Medications as of 12/18/2017  Medication Sig  . Ascorbic Acid (VITAMIN C) 100 MG tablet Take 100 mg by mouth daily.  . ferrous sulfate 325 (65 FE) MG EC tablet Take 325 mg by mouth 3 (three) times daily with meals.  . thiamine (VITAMIN B-1) 100 MG tablet Take 100 mg by mouth daily.  . Turmeric 500 MG TABS Take by mouth.   No facility-administered encounter medications on file as of 12/18/2017.     Activities of Daily Living In your present state of health, do you have any difficulty performing the following activities: 12/18/2017 12/02/2017  Hearing? N N  Comment denies hearing aids -  Vision? N N  Comment wears eyeglasses -  Difficulty concentrating or making  decisions? N N  Walking or climbing stairs? Y N  Comment L knee pain -  Dressing or bathing? N N  Doing errands, shopping? N N  Preparing Food and eating ? N -  Comment denies dentures -  Using the Toilet? N -  In the past six months, have you accidently leaked urine? N -  Do you have problems with loss of bowel control? N -  Managing your Medications? N -  Managing your Finances? N -  Housekeeping or managing your Housekeeping? N -  Some recent data might be hidden    Patient Care Team: Glean Hess, MD as PCP - General (Internal Medicine)    Assessment:   This is a routine wellness examination for Phyllis Knox.  Exercise Activities and  Dietary recommendations Current Exercise Habits: The patient does not participate in regular exercise at present, Exercise limited by: None identified  Goals    . DIET - INCREASE WATER INTAKE     Recommend to drink at least 6-8 8oz glasses of water per day.       Fall Risk Fall Risk  12/18/2017 12/02/2017  Falls in the past year? No No   Is the patient's home free of loose throw rugs in walkways, pet beds, electrical cords, etc?   Yes Does the patient have any grab bars in the bathroom? Yes  Does the patient use a shower chair when bathing? Yes Does the patient have any stairs in or around the home? Yes If so, are there any handrails?  Yes Does the patient have adequate lighting?  Yes Does the patient use a cane, walker or w/c? No Does the patient use of an elevated toilet seat? No  Timed Get Up and Go Performed: Yes. Pt ambulated 10 feet within 7 sec. Gait stead-fast and without the use of an assistive device. No intervention required at this time. Fall risk prevention has been discussed.  Pt declined my offer to send Community Resource Referral to Care Guide for an elevated toilet seat.  Depression Screen PHQ 2/9 Scores 12/18/2017 12/02/2017  PHQ - 2 Score 0 0     Cognitive Function     6CIT Screen 12/18/2017  What Year? 0 points  What month? 0 points  What time? 0 points  Count back from 20 0 points  Months in reverse 0 points  Repeat phrase 0 points  Total Score 0    Immunization History  Administered Date(s) Administered  . Pneumococcal Conjugate-13 12/18/2017  . Tdap 02/26/2014    Qualifies for Shingles Vaccine? Yes. Due for Zostavax or Shingrix vaccine. Education has been provided regarding the importance of this vaccine. Pt has been advised to call her insurance company to determine her out of pocket expense. Advised she may also receive this vaccine at her local pharmacy or Health Dept. Verbalized acceptance and understanding.  Due for Flu vaccine. Declined my  offer to administer today. Education has been provided regarding the importance of this vaccine but still declined. Pt has been advised to call our office if she should change her mind and decide she would like to receive this vaccine. Also advised she may also receive this vaccine at her local pharmacy or Health Dept. Verbalized acceptance and understanding.  Screening Tests Health Maintenance  Topic Date Due  . INFLUENZA VACCINE  08/29/2018 (Originally 05/29/2017)  . DEXA SCAN  12/18/2018 (Originally 11/05/2017)  . MAMMOGRAM  12/17/2018  . PNA vac Low Risk Adult (2 of 2 - PPSV23) 12/18/2018  .  PAP SMEAR  10/08/2019  . COLONOSCOPY  10/29/2021  . TETANUS/TDAP  02/27/2024  . Hepatitis C Screening  Completed  . HIV Screening  Discontinued    Cancer Screenings: Lung: Low Dose CT Chest recommended if Age 22-80 years, 30 pack-year currently smoking OR have quit w/in 15years. Patient does not qualify. NON-SMOKER Breast:  Up to date on Mammogram? Yes. Completed 12/17/17. Repeat every year.  Up to date of Bone Density/Dexa? No. Declined my offer to order this screening today. Education has been provided regarding the importance of this screening but still declined. Colorectal: Completed colonoscopy 10/30/11. Repeat every 10 years.  Additional Screenings: Hepatitis B/HIV/Syphillis: Does not qualify Hepatitis C Screening: Completed 11/23/14      Plan:  I have personally reviewed and addressed the Medicare Annual Wellness questionnaire and have noted the following in the patient's chart:  A. Medical and social history B. Use of alcohol, tobacco or illicit drugs  C. Current medications and supplements D. Functional ability and status E.  Nutritional status F.  Physical activity G. Advance directives H. List of other physicians I.  Hospitalizations, surgeries, and ER visits in previous 12 months J.  [place] such as hearing and vision if needed, cognitive and depression L. Referrals  and appointments - none  In addition, I have reviewed and discussed with patient certain preventive protocols, quality metrics, and best practice recommendations. A written personalized care plan for preventive services as well as general preventive health recommendations were provided to patient.  Signed,  Aleatha Borer, LPN Nurse Health Advisor  MD Recommendations: Due for Zostavax or Shingrix vaccine. Education has been provided regarding the importance of this vaccine. Pt has been advised to call her insurance company to determine her out of pocket expense. Advised she may also receive this vaccine at her local pharmacy or Health Dept. Verbalized acceptance and understanding.  Due for Flu vaccine. Declined my offer to administer today. Education has been provided regarding the importance of this vaccine but still declined. Pt has been advised to call our office if she should change her mind and decide she would like to receive this vaccine. Also advised she may also receive this vaccine at her local pharmacy or Health Dept. Verbalized acceptance and understanding.  Due for DEXA. Declined my offer to order this screening today. Education has been provided regarding the importance of this screening but still declined.

## 2018-05-06 ENCOUNTER — Encounter: Payer: PPO | Admitting: Internal Medicine

## 2018-06-12 IMAGING — CR DG CHEST 2V
2 series · 2 of 2 positions shown · non-contrast
Comparison: None.

CLINICAL DATA: Productive cough for 5 days.  Fever, congestion

EXAM:
CHEST  2 VIEW

[chest pa]
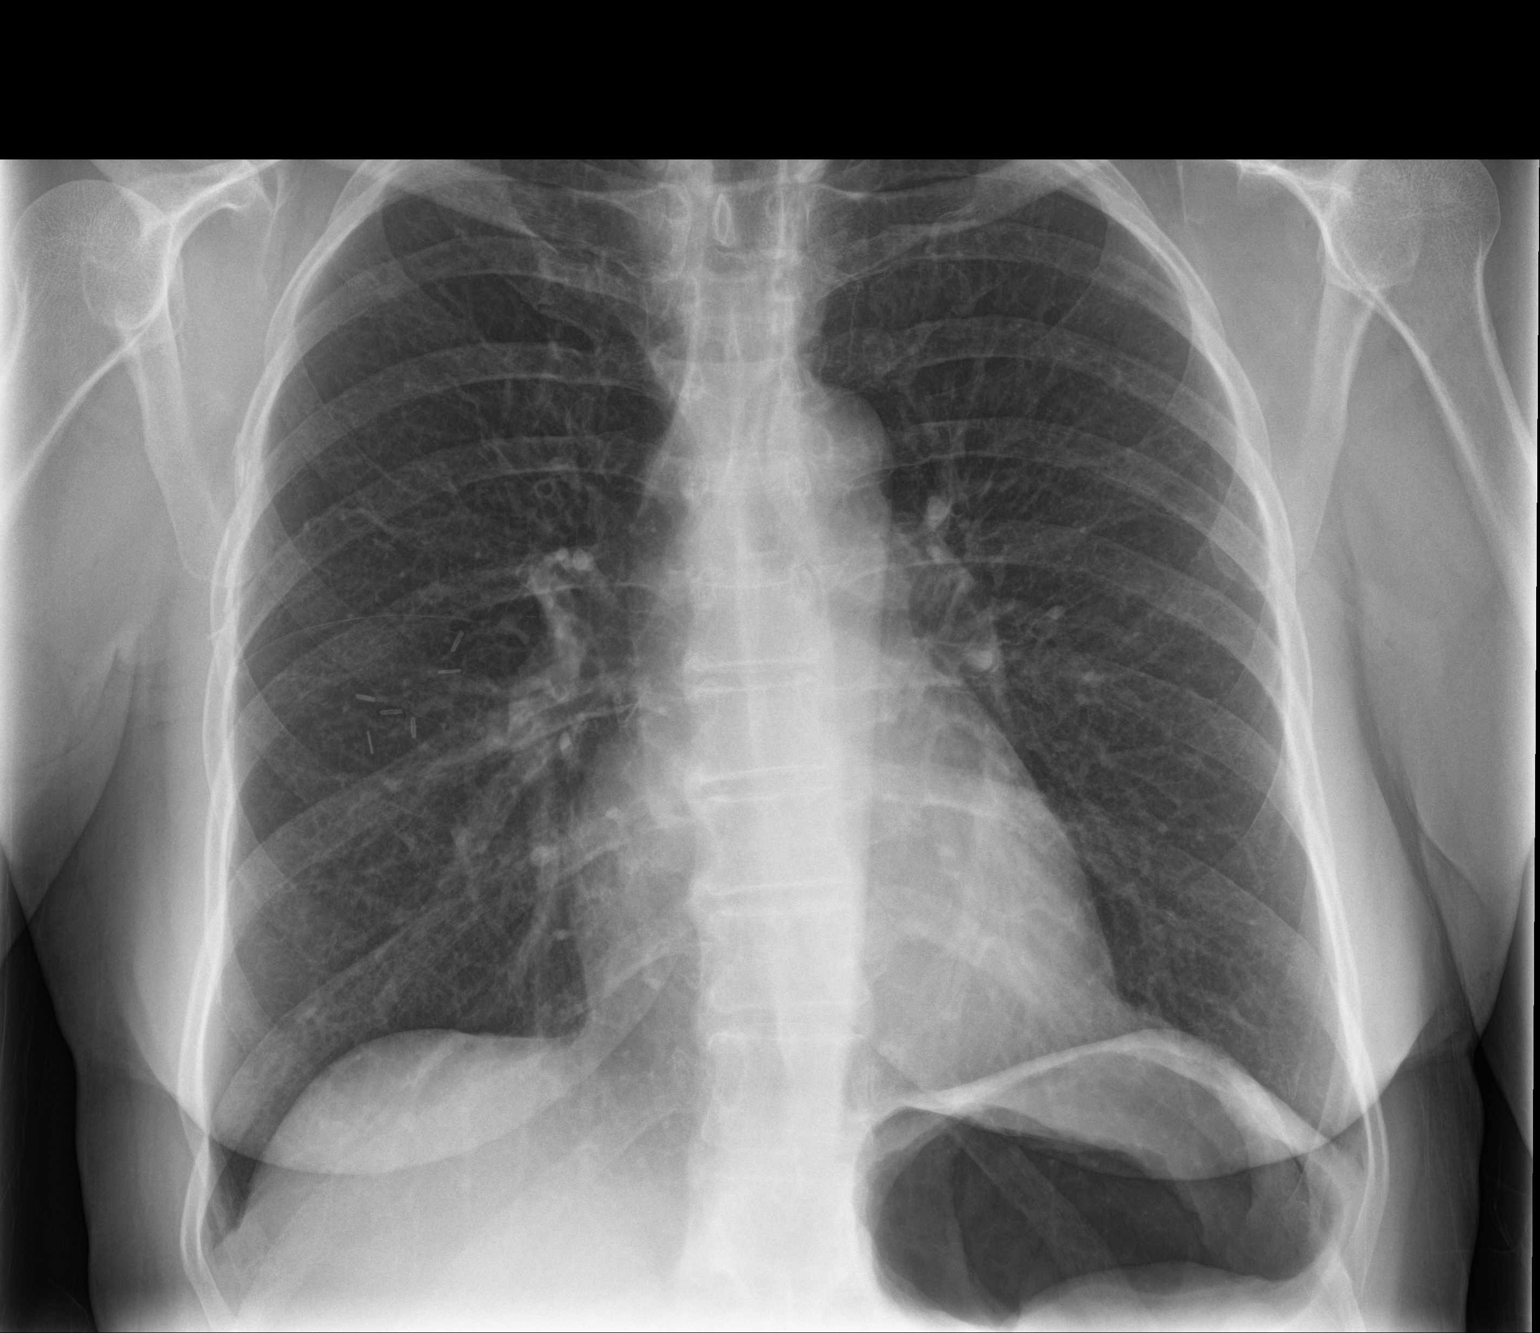

[chest lat]
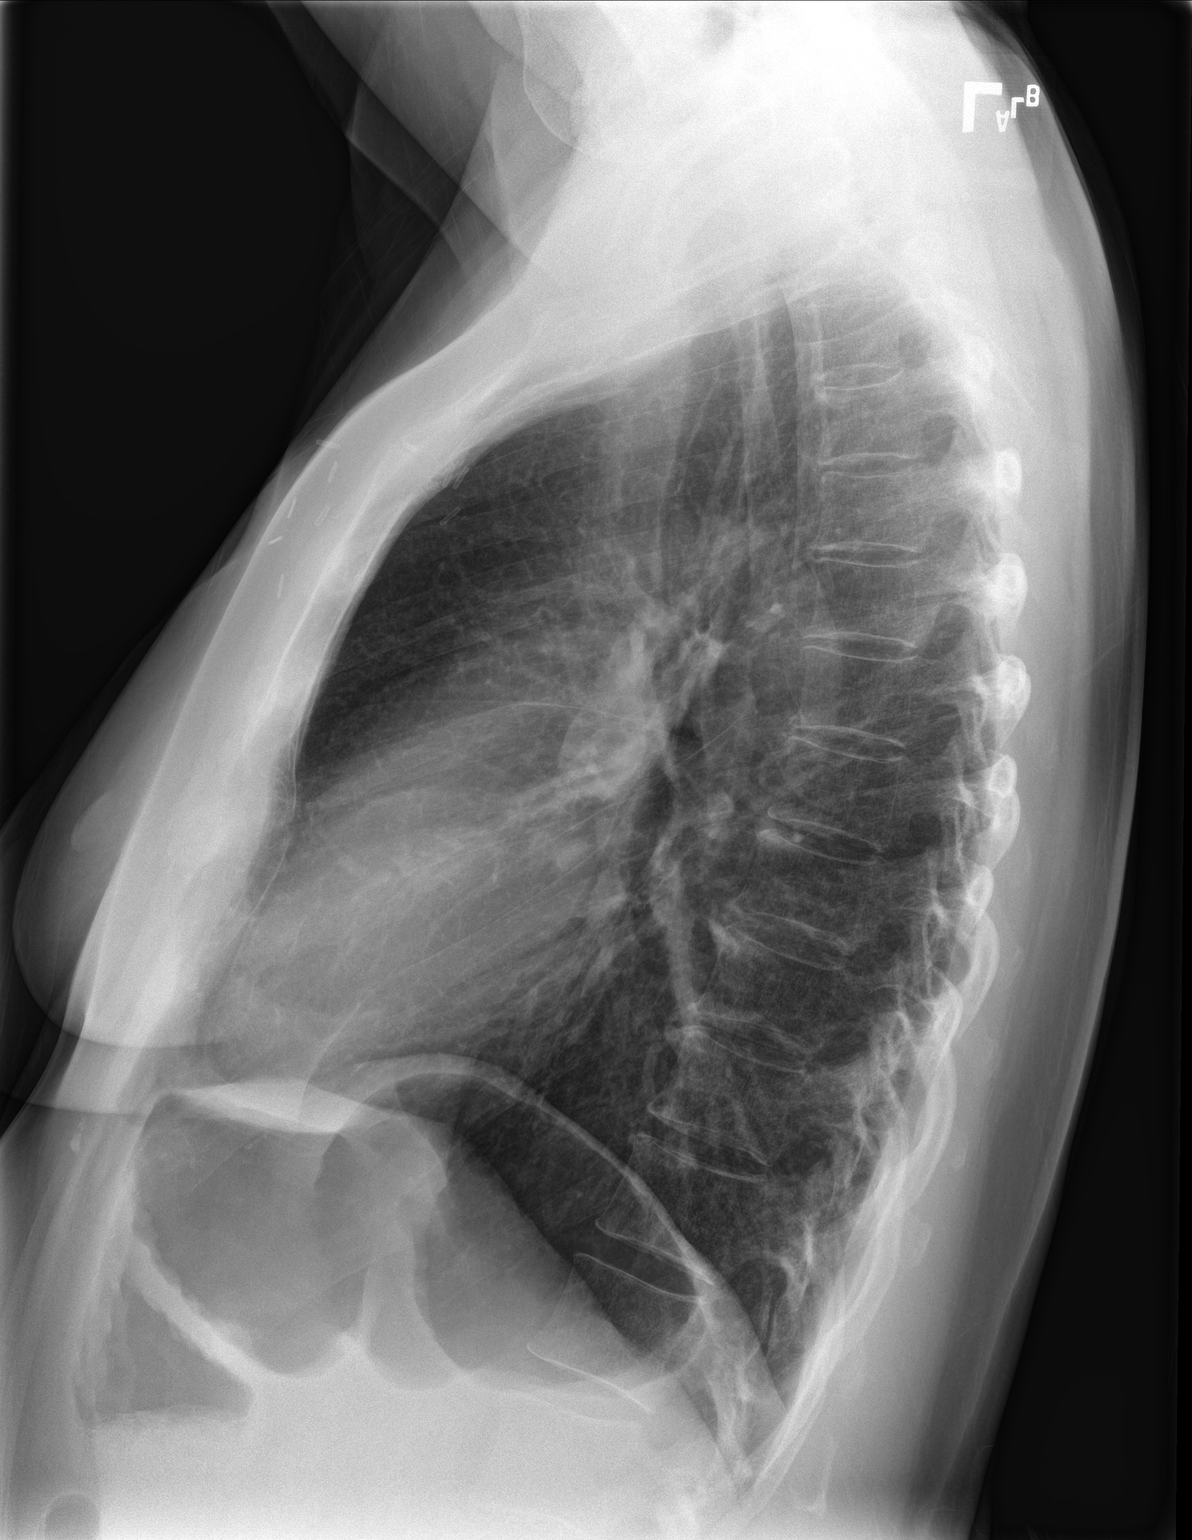

[2 of 2 positions shown; findings below may reference images not displayed]

FINDINGS: Heart and mediastinal contours are within normal limits. No focal
opacities or effusions. No acute bony abnormality.
IMPRESSION: No active cardiopulmonary disease.

## 2018-08-25 ENCOUNTER — Ambulatory Visit (INDEPENDENT_AMBULATORY_CARE_PROVIDER_SITE_OTHER): Payer: PPO | Admitting: Internal Medicine

## 2018-08-25 ENCOUNTER — Encounter: Payer: Self-pay | Admitting: Internal Medicine

## 2018-08-25 VITALS — BP 120/76 | HR 78 | Resp 16 | Ht 68.0 in | Wt 179.0 lb

## 2018-08-25 DIAGNOSIS — K644 Residual hemorrhoidal skin tags: Secondary | ICD-10-CM

## 2018-08-25 DIAGNOSIS — Z23 Encounter for immunization: Secondary | ICD-10-CM

## 2018-08-25 DIAGNOSIS — Z1231 Encounter for screening mammogram for malignant neoplasm of breast: Secondary | ICD-10-CM

## 2018-08-25 DIAGNOSIS — Z0000 Encounter for general adult medical examination without abnormal findings: Secondary | ICD-10-CM

## 2018-08-25 LAB — POCT URINALYSIS DIPSTICK
BILIRUBIN UA: NEGATIVE
Blood, UA: NEGATIVE
GLUCOSE UA: NEGATIVE
Ketones, UA: NEGATIVE
Leukocytes, UA: NEGATIVE
Nitrite, UA: NEGATIVE
PH UA: 6 (ref 5.0–8.0)
Protein, UA: NEGATIVE
Spec Grav, UA: <=1.005 — AB (ref 1.010–1.025)
Urobilinogen, UA: 0.2 E.U./dL

## 2018-08-25 MED ORDER — HYDROCORTISONE 2.5 % RE CREA: 1.0000 "application " | TOPICAL_CREAM | Freq: Two times a day (BID) | RECTAL | 0 refills | Status: DC

## 2018-08-25 NOTE — Progress Notes (Signed)
Date:  08/25/2018   Name:  Phyllis Knox   DOB:  1951-07-04   MRN:  [id number]   Chief Complaint: Annual Exam GISSELLE Knox is a 67 y.o. female who presents today for her Complete Annual Exam. She feels well. She reports exercising doing yard work and some walking. She reports she is sleeping fairly well.   Pap is due next year unless she decides to discontinue screening.  Colonoscopy was done in 2013 - due in 2023. She would be due for PPV-23 in February. Mammogram was done in February at [facility] read as BiRADS 2.  She is s/p right lumpectomy. Rectal Bleeding   The current episode started more than 1 week ago. The problem occurs rarely. The pain is mild. The stool is described as streaked with blood. Prior successful therapies include laxatives. Associated symptoms include abdominal pain (LLQ intermittent, improves after stool) and hemorrhoids. Pertinent negatives include no fever, no diarrhea, no vomiting, no vaginal bleeding, no vaginal discharge, no chest pain, no headaches, no coughing and no rash.    Review of Systems  Constitutional: Negative for chills, fatigue and fever.  HENT: Negative for congestion, hearing loss, tinnitus, trouble swallowing and voice change.   Eyes: Negative for visual disturbance.  Respiratory: Negative for cough, chest tightness, shortness of breath and wheezing.   Cardiovascular: Negative for chest pain, palpitations and leg swelling.  Gastrointestinal: Positive for abdominal pain (LLQ intermittent, improves after stool), hematochezia and hemorrhoids. Negative for constipation, diarrhea and vomiting.       Hemorrhoidal irritation  Endocrine: Negative for polydipsia and polyuria.  Genitourinary: Negative for dysuria, frequency, genital sores, vaginal bleeding and vaginal discharge.  Musculoskeletal: Negative for arthralgias, gait problem and joint swelling.  Skin: Negative for color change and rash.  Neurological: Negative for dizziness, tremors,  light-headedness and headaches.  Hematological: Negative for adenopathy. Does not bruise/bleed easily.  Psychiatric/Behavioral: Negative for dysphoric mood and sleep disturbance. The patient is not nervous/anxious.     Patient Active Problem List   Diagnosis Date Noted  . History of cancer of right breast 12/02/2017    No Known Allergies  Past Surgical History:  Procedure Laterality Date  . BREAST LUMPECTOMY Right 2010  . COLONOSCOPY  10/2011   Normal  . TONSILLECTOMY      Social History   Tobacco Use  . Smoking status: Never Smoker  . Smokeless tobacco: Never Used  . Tobacco comment: smoking cessation materials not required  Substance Use Topics  . Alcohol use: No    Frequency: Never  . Drug use: No     Medication list has been reviewed and updated.  Current Meds  Medication Sig  . Ascorbic Acid (VITAMIN C) 100 MG tablet Take 1,000 mg by mouth daily.   . Cholecalciferol (VITAMIN D-3) 1000 units CAPS Take by mouth.  . D-MANNOSE PO Take 2,000 mg by mouth.  . gelatin 650 MG capsule Take 1,300 mg by mouth daily.  . Multiple Minerals-Vitamins (CAL-MAG ZINC III PO) Take 1,000 mg by mouth. 100mg -400-15 mg  . Psyllium ([employer] NATURAL DAILY FIBER PO) Take by mouth.  . Turmeric 500 MG TABS Take by mouth.  . [DISCONTINUED] ferrous sulfate 325 (65 FE) MG EC tablet Take 325 mg by mouth 3 (three) times daily with meals.  . [DISCONTINUED] thiamine (VITAMIN B-1) 100 MG tablet Take 100 mg by mouth daily.    PHQ 2/9 Scores 12/18/2017 12/02/2017  PHQ - 2 Score 0 0    Physical Exam  Constitutional: She is oriented to person, place, and time. She appears well-developed and well-nourished. No distress.  HENT:  Head: Normocephalic and atraumatic.  Right Ear: Tympanic membrane and ear canal normal.  Left Ear: Tympanic membrane and ear canal normal.  Nose: Right sinus exhibits no maxillary sinus tenderness. Left sinus exhibits no maxillary sinus tenderness.  Mouth/Throat: Uvula is  midline and oropharynx is clear and moist.  Eyes: Conjunctivae and EOM are normal. Right eye exhibits no discharge. Left eye exhibits no discharge. No scleral icterus.  Neck: Normal range of motion. Carotid bruit is not present. No erythema present. No thyromegaly present.  Cardiovascular: Normal rate, regular rhythm, normal heart sounds and normal pulses.  Pulmonary/Chest: Effort normal. No respiratory distress. She has no wheezes. Right breast exhibits skin change. Right breast exhibits no mass, no nipple discharge and no tenderness. Left breast exhibits no mass, no nipple discharge, no skin change and no tenderness.    Abdominal: Soft. Bowel sounds are normal. There is no hepatosplenomegaly. There is no tenderness. There is no CVA tenderness.  Genitourinary: Vagina normal and uterus normal. Rectal exam shows external hemorrhoid. Rectal exam shows guaiac negative stool. There is no rash, tenderness or lesion on the right labia. There is no rash, tenderness or lesion on the left labia. Cervix exhibits no motion tenderness. Right adnexum displays no mass, no tenderness and no fullness. Left adnexum displays no mass, no tenderness and no fullness.  Musculoskeletal: Normal range of motion.  Lymphadenopathy:    She has no cervical adenopathy.    She has no axillary adenopathy.  Neurological: She is alert and oriented to person, place, and time. She has normal reflexes. No cranial nerve deficit or sensory deficit.  Skin: Skin is warm, dry and intact. No rash noted.  Psychiatric: She has a normal mood and affect. Her speech is normal and behavior is normal. Thought content normal.  Nursing note and vitals reviewed.   BP 120/76   Pulse 78   Resp 16   Ht 5\' 8"  (1.727 m)   Wt 179 lb (81.2 kg)   SpO2 100%   BMI 27.22 kg/m   Assessment and Plan: 1. Annual physical exam Normal exam Continue exercise, healthy diet - CBC with Differential/Platelet - Comprehensive metabolic panel - Lipid panel -  TSH - POCT urinalysis dipstick  2. Encounter for screening mammogram for breast cancer Almost 10 years out from lumpectomy and XRT Schedule at [facility] breast imaging - MM 3D SCREEN BREAST BILATERAL  3. Need for immunization against influenza - Flu Vaccine QUAD 36+ mos IM  4. Residual hemorrhoidal skin tags Control constipation with fiber and fluids Anusol as needed for inflammation - hydrocortisone (ANUSOL-HC) 2.5 % rectal cream; Place 1 application rectally 2 (two) times daily.  Dispense: 30 g; Refill: 0   Partially dictated using Editor, commissioning. Any errors are unintentional.  Halina Maidens, MD [facility]  08/25/2018

## 2018-08-26 ENCOUNTER — Encounter: Payer: Self-pay | Admitting: Internal Medicine

## 2018-08-26 DIAGNOSIS — E782 Mixed hyperlipidemia: Secondary | ICD-10-CM | POA: Insufficient documentation

## 2018-08-26 LAB — CBC WITH DIFFERENTIAL/PLATELET
BASOS ABS: 0 10*3/uL (ref 0.0–0.2)
Basos: 1 %
EOS (ABSOLUTE): 0.2 10*3/uL (ref 0.0–0.4)
Eos: 3 %
Hematocrit: 39.5 % (ref 34.0–46.6)
Hemoglobin: 13.1 g/dL (ref 11.1–15.9)
IMMATURE GRANULOCYTES: 0 %
Immature Grans (Abs): 0 10*3/uL (ref 0.0–0.1)
LYMPHS ABS: 1.9 10*3/uL (ref 0.7–3.1)
Lymphs: 33 %
MCH: 29 pg (ref 26.6–33.0)
MCHC: 33.2 g/dL (ref 31.5–35.7)
MCV: 87 fL (ref 79–97)
MONOCYTES: 6 %
MONOS ABS: 0.3 10*3/uL (ref 0.1–0.9)
NEUTROS PCT: 57 %
Neutrophils Absolute: 3.3 10*3/uL (ref 1.4–7.0)
Platelets: 294 10*3/uL (ref 150–450)
RBC: 4.52 x10E6/uL (ref 3.77–5.28)
RDW: 12.4 % (ref 12.3–15.4)
WBC: 5.7 10*3/uL (ref 3.4–10.8)

## 2018-08-26 LAB — COMPREHENSIVE METABOLIC PANEL
ALK PHOS: 53 IU/L (ref 39–117)
ALT: 26 IU/L (ref 0–32)
AST: 24 IU/L (ref 0–40)
Albumin/Globulin Ratio: 1.4 (ref 1.2–2.2)
Albumin: 4.2 g/dL (ref 3.6–4.8)
BUN/Creatinine Ratio: 20 (ref 12–28)
BUN: 16 mg/dL (ref 8–27)
Bilirubin Total: 0.4 mg/dL (ref 0.0–1.2)
CALCIUM: 9.8 mg/dL (ref 8.7–10.3)
CO2: 25 mmol/L (ref 20–29)
CREATININE: 0.82 mg/dL (ref 0.57–1.00)
Chloride: 101 mmol/L (ref 96–106)
GFR calc Af Amer: 87 mL/min/{1.73_m2} (ref >59)
GFR calc non Af Amer: 75 mL/min/{1.73_m2} (ref >59)
GLUCOSE: 87 mg/dL (ref 65–99)
Globulin, Total: 3 g/dL (ref 1.5–4.5)
Potassium: 4.3 mmol/L (ref 3.5–5.2)
Sodium: 139 mmol/L (ref 134–144)
Total Protein: 7.2 g/dL (ref 6.0–8.5)

## 2018-08-26 LAB — LIPID PANEL
Chol/HDL Ratio: 5.1 ratio — ABNORMAL HIGH (ref 0.0–4.4)
Cholesterol, Total: 240 mg/dL — ABNORMAL HIGH (ref 100–199)
HDL: 47 mg/dL (ref >39)
LDL CALC: 152 mg/dL — AB (ref 0–99)
Triglycerides: 207 mg/dL — ABNORMAL HIGH (ref 0–149)
VLDL Cholesterol Cal: 41 mg/dL — ABNORMAL HIGH (ref 5–40)

## 2018-08-26 LAB — TSH: TSH: 2.5 u[IU]/mL (ref 0.450–4.500)

## 2018-09-05 ENCOUNTER — Encounter

## 2018-12-18 ENCOUNTER — Ambulatory Visit: Payer: PPO

## 2018-12-22 ENCOUNTER — Ambulatory Visit (INDEPENDENT_AMBULATORY_CARE_PROVIDER_SITE_OTHER): Payer: PPO

## 2018-12-22 VITALS — BP 124/86 | HR 70 | Temp 97.9°F | Resp 16 | Ht 68.0 in | Wt 188.2 lb

## 2018-12-22 DIAGNOSIS — Z23 Encounter for immunization: Secondary | ICD-10-CM | POA: Diagnosis not present

## 2018-12-22 DIAGNOSIS — Z0000 Encounter for general adult medical examination without abnormal findings: Secondary | ICD-10-CM

## 2018-12-22 DIAGNOSIS — Z780 Asymptomatic menopausal state: Secondary | ICD-10-CM | POA: Diagnosis not present

## 2018-12-22 NOTE — Progress Notes (Signed)
Subjective:   Phyllis Knox is a 68 y.o. female who presents for Medicare Annual (Subsequent) preventive examination.  Review of Systems:   Cardiac Risk Factors include: advanced age (>58men, >68 women);dyslipidemia     Objective:     Vitals: BP 124/86 (BP Location: Left Arm, Patient Position: Sitting, Cuff Size: Normal)   Pulse 70   Temp 97.9 F (36.6 C) (Oral)   Resp 16   Ht 5\' 8"  (1.727 m)   Wt 188 lb 3.2 oz (85.4 kg)   SpO2 98%   BMI 28.62 kg/m   Body mass index is 28.62 kg/m.  Advanced Directives 12/22/2018 08/25/2018 12/18/2017  Does Patient Have a Medical Advance Directive? No Yes Yes  Type of Advance Directive - [employer];Living will [place];Living will  Does patient want to make changes to medical advance directive? - No - Patient declined -  Copy of [place] in Chart? - No - copy requested No - copy requested  Would patient like information on creating a medical advance directive? No - Patient declined - -    Tobacco Social History   Tobacco Use  Smoking Status Never Smoker  Smokeless Tobacco Never Used  Tobacco Comment   smoking cessation materials not required     Counseling given: Not Answered Comment: smoking cessation materials not required   Clinical Intake:  Pre-visit preparation completed: No  Pain : No/denies pain     BMI - recorded: 28.62 Nutritional Status: BMI 25 -29 Overweight Nutritional Risks: None Diabetes: No  How often do you need to have someone help you when you read instructions, pamphlets, or other written materials from your doctor or pharmacy?: 1 - Never What is the last grade level you completed in school?: 12th grade  Interpreter Needed?: No  Information entered by :: Clemetine Marker LPN  Past Medical History:  Diagnosis Date  . Cancer ([place]) 2010   Breast Cancer  . Hyperlipidemia, mixed    Past Surgical History:  Procedure Laterality Date  .  BREAST LUMPECTOMY Right 2010  . COLONOSCOPY  10/2011   Normal  . TONSILLECTOMY     Family History  Problem Relation Age of Onset  . Breast cancer Mother   . Prostate cancer Father    Social History   Socioeconomic History  . Marital status: Married    Spouse name: Not on file  . Number of children: 3  . Years of education: some college  . Highest education level: 12th grade  Occupational History    Employer: [employer]  Social Needs  . Financial resource strain: Not hard at all  . Food insecurity:    Worry: Never true    Inability: Never true  . Transportation needs:    Medical: No    Non-medical: No  Tobacco Use  . Smoking status: Never Smoker  . Smokeless tobacco: Never Used  . Tobacco comment: smoking cessation materials not required  Substance and Sexual Activity  . Alcohol use: No    Frequency: Never  . Drug use: No  . Sexual activity: Yes    Birth control/protection: Post-menopausal  Lifestyle  . Physical activity:    Days per week: 0 days    Minutes per session: 0 min  . Stress: Not at all  Relationships  . Social connections:    Talks on phone: More than three times a week    Gets together: Three times a week    Attends religious service:  More than 4 times per year    Active member of club or organization: No    Attends meetings of clubs or organizations: Never    Relationship status: Married  Other Topics Concern  . Not on file  Social History Narrative  . Not on file    Outpatient Encounter Medications as of 12/22/2018  Medication Sig  . Ascorbic Acid (VITAMIN C) 100 MG tablet Take 1,000 mg by mouth daily.   . Cholecalciferol (VITAMIN D-3) 1000 units CAPS Take by mouth.  . Multiple Minerals-Vitamins (CAL-MAG ZINC III PO) Take 1,000 mg by mouth. 100mg -400-15 mg  . Psyllium ([employer] NATURAL DAILY FIBER PO) Take by mouth.  . Turmeric 500 MG TABS Take by mouth.  . [DISCONTINUED] D-MANNOSE PO Take 2,000 mg by mouth.  . [DISCONTINUED] gelatin  650 MG capsule Take 1,300 mg by mouth daily.  . [DISCONTINUED] hydrocortisone (ANUSOL-HC) 2.5 % rectal cream Place 1 application rectally 2 (two) times daily.   No facility-administered encounter medications on file as of 12/22/2018.     Activities of Daily Living In your present state of health, do you have any difficulty performing the following activities: 12/22/2018  Hearing? N  Comment declines hearing aids  Vision? N  Comment wears glasses  Difficulty concentrating or making decisions? N  Walking or climbing stairs? N  Dressing or bathing? N  Doing errands, shopping? N  Preparing Food and eating ? N  Using the Toilet? N  In the past six months, have you accidently leaked urine? N  Do you have problems with loss of bowel control? N  Managing your Medications? N  Managing your Finances? N  Housekeeping or managing your Housekeeping? N  Some recent data might be hidden    Patient Care Team: Glean Hess, MD as PCP - General (Internal Medicine)    Assessment:   This is a routine wellness examination for Phyllis Knox.  Exercise Activities and Dietary recommendations Current Exercise Habits: The patient does not participate in regular exercise at present  Goals    . DIET - INCREASE WATER INTAKE     Recommend to drink at least 6-8 8oz glasses of water per day.    . Weight (lb) < 170 lb (77.1 kg)     Pt would like to lose 10 lbs over the next year       Fall Risk Fall Risk  12/22/2018 08/25/2018 12/18/2017 12/02/2017  Falls in the past year? 1 No No No  Number falls in past yr: 1 - - -  Comment tripped over dog - - -  Injury with Fall? 0 - - -  Follow up Falls prevention discussed - - -   FALL RISK PREVENTION PERTAINING TO THE HOME:  Any stairs in or around the home? Yes  If so, do they handrails? Yes   Home free of loose throw rugs in walkways, pet beds, electrical cords, etc? Yes  Adequate lighting in your home to reduce risk of falls? Yes   ASSISTIVE DEVICES  UTILIZED TO PREVENT FALLS:  Life alert? No  Use of a cane, walker or w/c? No  Grab bars in the bathroom? Yes  Shower chair or bench in shower? Yes  Elevated toilet seat or a handicapped toilet? No   DME ORDERS:  DME order needed?  No   TIMED UP AND GO:  Was the test performed? Yes .  Length of time to ambulate 10 feet: 5 sec.   GAIT:  Appearance of gait: Gait stead-fast  and without the use of an assistive device.   Education: Fall risk prevention has been discussed.  Intervention(s) required? No  '  Depression Screen PHQ 2/9 Scores 12/22/2018 08/25/2018 12/18/2017 12/02/2017  PHQ - 2 Score 0 0 0 0  PHQ- 9 Score - 0 - -     Cognitive Function     6CIT Screen 12/22/2018 12/18/2017  What Year? 0 points 0 points  What month? 0 points 0 points  What time? 0 points 0 points  Count back from 20 0 points 0 points  Months in reverse 0 points 0 points  Repeat phrase 0 points 0 points  Total Score 0 0    Immunization History  Administered Date(s) Administered  . Influenza,inj,Quad PF,6+ Mos 08/25/2018  . Pneumococcal Conjugate-13 12/18/2017  . Tdap 02/26/2014    Qualifies for Shingles Vaccine? Yes  . Due for Shingrix. Education has been provided regarding the importance of this vaccine. Pt has been advised to call insurance company to determine out of pocket expense. Advised may also receive vaccine at local pharmacy or Health Dept. Verbalized acceptance and understanding.  Tdap: Up to date  Flu Vaccine: Up to date  Pneumococcal Vaccine: Due for Pneumococcal vaccine. Does the patient want to receive this vaccine today?  Yes . Education has been provided regarding the importance of this vaccine but still declined. Advised may receive this vaccine at local pharmacy or Health Dept. Aware to provide a copy of the vaccination record if obtained from local pharmacy or Health Dept. Verbalized acceptance and understanding.   Screening Tests Health Maintenance  Topic Date Due  .  DEXA SCAN  11/05/2017  . MAMMOGRAM  12/17/2018  . PNA vac Low Risk Adult (2 of 2 - PPSV23) 12/18/2018  . COLONOSCOPY  10/29/2021  . TETANUS/TDAP  02/27/2024  . INFLUENZA VACCINE  Completed  . Hepatitis C Screening  Completed    Cancer Screenings:  Colorectal Screening: Completed 2013. Repeat every 10 years.  Mammogram: Completed 12/17/17. Repeat every year;  Ordered 08/25/18. Pt provided with contact information and advised to call to schedule appt.   Bone Density: Ordered today. Pt provided with contact information and advised to call to schedule appt.   Lung Cancer Screening: (Low Dose CT Chest recommended if Age 53-80 years, 30 pack-year currently smoking OR have quit w/in 15years.) does not qualify.   Additional Screening:  Hepatitis C Screening: does qualify; Completed 11/23/14  Vision Screening: Recommended annual ophthalmology exams for early detection of glaucoma and other disorders of the eye. Is the patient up to date with their annual eye exam?  Yes  Who is the provider or what is the name of the office in which the pt attends annual eye exams? Dr. Wyatt Portela  Dental Screening: Recommended annual dental exams for proper oral hygiene  Community Resource Referral:  CRR required this visit?  No      Plan:     I have personally reviewed and addressed the Medicare Annual Wellness questionnaire and have noted the following in the patient's chart:  A. Medical and social history B. Use of alcohol, tobacco or illicit drugs  C. Current medications and supplements D. Functional ability and status E.  Nutritional status F.  Physical activity G. Advance directives H. List of other physicians I.  Hospitalizations, surgeries, and ER visits in previous 12 months J.  [place] such as hearing and vision if needed, cognitive and depression L. Referrals and appointments   In addition, I have reviewed and  discussed with patient certain preventive protocols, quality  metrics, and best practice recommendations. A written personalized care plan for preventive services as well as general preventive health recommendations were provided to patient.   Signed,  Clemetine Marker, LPN Nurse Health Advisor   Nurse Notes: pt sliced her right index finer while shaving a carrot last night. She went to the ER but did not stay because they told her all they would do is put some ointment and bandage on it. She showed the ct to me today and it is not a deep laceration requiring stitches and does not show any signs of infection.

## 2018-12-22 NOTE — Patient Instructions (Addendum)
Phyllis Knox , Thank you for taking time to come for your Medicare Wellness Visit. I appreciate your ongoing commitment to your health goals. Please review the following plan we discussed and let me know if I can assist you in the future.   Screening recommendations/referrals: Colonoscopy: done 2013. Repeat in 2023. Mammogram: done 12/17/17. Please call to schedule at [facility]. Bone Density: Please call [phone] to schedule your bone density. Recommended yearly ophthalmology/optometry visit for glaucoma screening and checkup Recommended yearly dental visit for hygiene and checkup  Vaccinations: Influenza vaccine: done 08/25/18 Pneumococcal vaccine: done today Tdap vaccine: done 02/26/14 Shingles vaccine: Shingrix discussed. Please contact your pharmacy for coverage information.   Advanced directives: Please bring a copy of your health care power of attorney and living will to the office at your convenience once you have completed those documents.   Conditions/risks identified: Keep up the great work!  Next appointment: Please follow up in one year for your Medicare Annual Wellness visit.     Preventive Care 66 Years and Older, Female Preventive care refers to lifestyle choices and visits with your health care provider that can promote health and wellness. What does preventive care include?  A yearly physical exam. This is also called an annual well check.  Dental exams once or twice a year.  Routine eye exams. Ask your health care provider how often you should have your eyes checked.  Personal lifestyle choices, including:  Daily care of your teeth and gums.  Regular physical activity.  Eating a healthy diet.  Avoiding tobacco and drug use.  Limiting alcohol use.  Practicing safe sex.  Taking low-dose aspirin every day.  Taking vitamin and mineral supplements as recommended by your health care provider. What happens during an annual well check? The services and  screenings done by your health care provider during your annual well check will depend on your age, overall health, lifestyle risk factors, and family history of disease. Counseling  Your health care provider may ask you questions about your:  Alcohol use.  Tobacco use.  Drug use.  Emotional well-being.  Home and relationship well-being.  Sexual activity.  Eating habits.  History of falls.  Memory and ability to understand (cognition).  Work and work Statistician.  Reproductive health. Screening  You may have the following tests or measurements:  Height, weight, and BMI.  Blood pressure.  Lipid and cholesterol levels. These may be checked every 5 years, or more frequently if you are over 66 years old.  Skin check.  Lung cancer screening. You may have this screening every year starting at age 66 if you have a 30-pack-year history of smoking and currently smoke or have quit within the past 15 years.  Fecal occult blood test (FOBT) of the stool. You may have this test every year starting at age 66.  Flexible sigmoidoscopy or colonoscopy. You may have a sigmoidoscopy every 5 years or a colonoscopy every 10 years starting at age 66.  Hepatitis C blood test.  Hepatitis B blood test.  Sexually transmitted disease (STD) testing.  Diabetes screening. This is done by checking your blood sugar (glucose) after you have not eaten for a while (fasting). You may have this done every 1-3 years.  Bone density scan. This is done to screen for osteoporosis. You may have this done starting at age 66.  Mammogram. This may be done every 1-2 years. Talk to your health care provider about how often you should have regular mammograms. Talk with your health  care provider about your test results, treatment options, and if necessary, the need for more tests. Vaccines  Your health care provider may recommend certain vaccines, such as:  Influenza vaccine. This is recommended every  year.  Tetanus, diphtheria, and acellular pertussis (Tdap, Td) vaccine. You may need a Td booster every 10 years.  Zoster vaccine. You may need this after age 66.  Pneumococcal 13-valent conjugate (PCV13) vaccine. One dose is recommended after age 66.  Pneumococcal polysaccharide (PPSV23) vaccine. One dose is recommended after age 66. Talk to your health care provider about which screenings and vaccines you need and how often you need them. This information is not intended to replace advice given to you by your health care provider. Make sure you discuss any questions you have with your health care provider. Document Released: 11/11/2015 Document Revised: 07/04/2016 Document Reviewed: 08/16/2015 Elsevier Interactive Patient Education  2017 [place] Prevention in the Home Falls can cause injuries. They can happen to people of all ages. There are many things you can do to make your home safe and to help prevent falls. What can I do on the outside of my home?  Regularly fix the edges of walkways and driveways and fix any cracks.  Remove anything that might make you trip as you walk through a door, such as a raised step or threshold.  Trim any bushes or trees on the path to your home.  Use bright outdoor lighting.  Clear any walking paths of anything that might make someone trip, such as rocks or tools.  Regularly check to see if handrails are loose or broken. Make sure that both sides of any steps have handrails.  Any raised decks and porches should have guardrails on the edges.  Have any leaves, snow, or ice cleared regularly.  Use sand or salt on walking paths during winter.  Clean up any spills in your garage right away. This includes oil or grease spills. What can I do in the bathroom?  Use night lights.  Install grab bars by the toilet and in the tub and shower. Do not use towel bars as grab bars.  Use non-skid mats or decals in the tub or shower.  If you  need to sit down in the shower, use a plastic, non-slip stool.  Keep the floor dry. Clean up any water that spills on the floor as soon as it happens.  Remove soap buildup in the tub or shower regularly.  Attach bath mats securely with double-sided non-slip rug tape.  Do not have throw rugs and other things on the floor that can make you trip. What can I do in the bedroom?  Use night lights.  Make sure that you have a light by your bed that is easy to reach.  Do not use any sheets or blankets that are too big for your bed. They should not hang down onto the floor.  Have a firm chair that has side arms. You can use this for support while you get dressed.  Do not have throw rugs and other things on the floor that can make you trip. What can I do in the kitchen?  Clean up any spills right away.  Avoid walking on wet floors.  Keep items that you use a lot in easy-to-reach places.  If you need to reach something above you, use a strong step stool that has a grab bar.  Keep electrical cords out of the way.  Do not use floor  polish or wax that makes floors slippery. If you must use wax, use non-skid floor wax.  Do not have throw rugs and other things on the floor that can make you trip. What can I do with my stairs?  Do not leave any items on the stairs.  Make sure that there are handrails on both sides of the stairs and use them. Fix handrails that are broken or loose. Make sure that handrails are as long as the stairways.  Check any carpeting to make sure that it is firmly attached to the stairs. Fix any carpet that is loose or worn.  Avoid having throw rugs at the top or bottom of the stairs. If you do have throw rugs, attach them to the floor with carpet tape.  Make sure that you have a light switch at the top of the stairs and the bottom of the stairs. If you do not have them, ask someone to add them for you. What else can I do to help prevent falls?  Wear shoes  that:  Do not have high heels.  Have rubber bottoms.  Are comfortable and fit you well.  Are closed at the toe. Do not wear sandals.  If you use a stepladder:  Make sure that it is fully opened. Do not climb a closed stepladder.  Make sure that both sides of the stepladder are locked into place.  Ask someone to hold it for you, if possible.  Clearly mark and make sure that you can see:  Any grab bars or handrails.  First and last steps.  Where the edge of each step is.  Use tools that help you move around (mobility aids) if they are needed. These include:  Canes.  Walkers.  Scooters.  Crutches.  Turn on the lights when you go into a dark area. Replace any light bulbs as soon as they burn out.  Set up your furniture so you have a clear path. Avoid moving your furniture around.  If any of your floors are uneven, fix them.  If there are any pets around you, be aware of where they are.  Review your medicines with your doctor. Some medicines can make you feel dizzy. This can increase your chance of falling. Ask your doctor what other things that you can do to help prevent falls. This information is not intended to replace advice given to you by your health care provider. Make sure you discuss any questions you have with your health care provider. Document Released: 08/11/2009 Document Revised: 03/22/2016 Document Reviewed: 11/19/2014 Elsevier Interactive Patient Education  2017 [place]  .Pneumococcal Polysaccharide Vaccine: What You Need to Know 1. Why get vaccinated? Vaccination can protect older adults (and some children and younger adults) from pneumococcal disease. Pneumococcal disease is caused by bacteria that can spread from person to person through close contact. It can cause ear infections, and it can also lead to more serious infections of the:  Lungs (pneumonia),  Blood (bacteremia), and  Covering of the brain and spinal cord (meningitis).  Meningitis can cause deafness and brain damage, and it can be fatal. Anyone can get pneumococcal disease, but children under 31 years of age, people with certain medical conditions, adults over 67 years of age, and cigarette smokers are at the highest risk. About 18,000 older adults die each year from pneumococcal disease in the [place]. Treatment of pneumococcal infections with penicillin and other drugs used to be more effective. But some strains of the disease have  become resistant to these drugs. This makes prevention of the disease, through vaccination, even more important. 2. Pneumococcal polysaccharide vaccine (PPSV23) Pneumococcal polysaccharide vaccine (PPSV23) protects against 23 types of pneumococcal bacteria. It will not prevent all pneumococcal disease. PPSV23 is recommended for:  All adults 62 years of age and older,  Anyone 2 through 66 years of age with certain long-term health problems,  Anyone 2 through 66 years of age with a weakened immune system,  Adults 36 through 66 years of age who smoke cigarettes or have asthma. Most people need only one dose of PPSV. A second dose is recommended for certain high-risk groups. People 10 and older should get a dose even if they have gotten one or more doses of the vaccine before they turned 65. Your healthcare provider can give you more information about these recommendations. Most healthy adults develop protection within 2 to 3 weeks of getting the shot. 3. Some people should not get this vaccine  Anyone who has had a life-threatening allergic reaction to PPSV should not get another dose.  Anyone who has a severe allergy to any component of PPSV should not receive it. Tell your provider if you have any severe allergies.  Anyone who is moderately or severely ill when the shot is scheduled may be asked to wait until they recover before getting the vaccine. Someone with a mild illness can usually be vaccinated.  Children less than  62 years of age should not receive this vaccine.  There is no evidence that PPSV is harmful to either a pregnant woman or to her fetus. However, as a precaution, women who need the vaccine should be vaccinated before becoming pregnant, if possible. 4. Risks of a vaccine reaction With any medicine, including vaccines, there is a chance of side effects. These are usually mild and go away on their own, but serious reactions are also possible. About half of people who get PPSV have mild side effects, such as redness or pain where the shot is given, which go away within about two days. Less than 1 out of 100 people develop a fever, muscle aches, or more severe local reactions. Problems that could happen after any vaccine:  People sometimes faint after a medical procedure, including vaccination. Sitting or lying down for about 15 minutes can help prevent fainting, and injuries caused by a fall. Tell your doctor if you feel dizzy, or have vision changes or ringing in the ears.  Some people get severe pain in the shoulder and have difficulty moving the arm where a shot was given. This happens very rarely.  Any medication can cause a severe allergic reaction. Such reactions from a vaccine are very rare, estimated at about 1 in a million doses, and would happen within a few minutes to a few hours after the vaccination. As with any medicine, there is a very remote chance of a vaccine causing a serious injury or death. The safety of vaccines is always being monitored. For more information, visit: http://www.aguilar.org/ 5. What if there is a serious reaction? What should I look for? Look for anything that concerns you, such as signs of a severe allergic reaction, very high fever, or unusual behavior. Signs of a severe allergic reaction can include hives, swelling of the face and throat, difficulty breathing, a fast heartbeat, dizziness, and weakness. These would usually start a few minutes to a few hours  after the vaccination. What should I do? If you think it is a severe allergic reaction or  other emergency that can't wait, call 9-1-1 or get to the nearest hospital. Otherwise, call your doctor. Afterward, the reaction should be reported to the Vaccine Adverse Event Reporting System (VAERS). Your doctor might file this report, or you can do it yourself through the VAERS web site at www.vaers.SamedayNews.es, or by calling (252)071-8876. VAERS does not give medical advice. 6. How can I learn more?  Ask your doctor. He or she can give you the vaccine package insert or suggest other sources of information.  Call your local or state health department.  Contact the Centers for Disease Control and Prevention (CDC): ? Call 651 089 9829 (1-800-CDC-INFO) or ? Visit CDC's website at http://hunter.com/ CDC Vaccine Information Statement PPSV Vaccine (02/19/2014) This information is not intended to replace advice given to you by your health care provider. Make sure you discuss any questions you have with your health care provider. Document Released: 08/12/2006 Document Revised: 05/27/2018 Document Reviewed: 05/27/2018 Elsevier Interactive Patient Education  2019 [employer].

## 2019-07-29 ENCOUNTER — Encounter: Payer: Self-pay | Admitting: Internal Medicine

## 2019-07-29 NOTE — Telephone Encounter (Signed)
Please advise 

## 2019-12-28 ENCOUNTER — Ambulatory Visit: Payer: PPO

## 2020-10-13 ENCOUNTER — Telehealth: Payer: Self-pay

## 2020-10-13 NOTE — Telephone Encounter (Signed)
Patient has not been seen in over a year. Needs to schedule a CPE with Dr. Army Melia. Please call and schedule next available CPE.  Thank you.

## 2021-03-14 DIAGNOSIS — K1230 Oral mucositis (ulcerative), unspecified: Secondary | ICD-10-CM | POA: Diagnosis not present

## 2021-07-26 DIAGNOSIS — H25013 Cortical age-related cataract, bilateral: Secondary | ICD-10-CM | POA: Diagnosis not present

## 2022-02-26 DIAGNOSIS — H40031 Anatomical narrow angle, right eye: Secondary | ICD-10-CM | POA: Diagnosis not present

## 2022-03-27 DIAGNOSIS — H40032 Anatomical narrow angle, left eye: Secondary | ICD-10-CM | POA: Diagnosis not present

## 2022-04-11 DIAGNOSIS — H40031 Anatomical narrow angle, right eye: Secondary | ICD-10-CM | POA: Diagnosis not present

## 2022-04-25 DIAGNOSIS — H40031 Anatomical narrow angle, right eye: Secondary | ICD-10-CM | POA: Diagnosis not present

## 2022-06-16 ENCOUNTER — Encounter: Payer: Self-pay | Admitting: Emergency Medicine

## 2022-06-16 ENCOUNTER — Ambulatory Visit
Admission: EM | Admit: 2022-06-16 | Discharge: 2022-06-16 | Disposition: A | Discharge status: Home / Self Care | Payer: PPO | Attending: Physician Assistant | Admitting: Physician Assistant

## 2022-06-16 DIAGNOSIS — R450 Nervousness: Secondary | ICD-10-CM

## 2022-06-16 DIAGNOSIS — R5383 Other fatigue: Secondary | ICD-10-CM

## 2022-06-16 DIAGNOSIS — R079 Chest pain, unspecified: Secondary | ICD-10-CM | POA: Diagnosis not present

## 2022-06-16 DIAGNOSIS — F419 Anxiety disorder, unspecified: Secondary | ICD-10-CM | POA: Diagnosis not present

## 2022-06-16 LAB — CBC WITH DIFFERENTIAL/PLATELET
Abs Immature Granulocytes: 0.02 10*3/uL (ref 0.00–0.07)
Basophils Absolute: 0 10*3/uL (ref 0.0–0.1)
Basophils Relative: 0 %
Eosinophils Absolute: 0 10*3/uL (ref 0.0–0.5)
Eosinophils Relative: 0 %
HCT: 43.1 % (ref 36.0–46.0)
Hemoglobin: 14.3 g/dL (ref 12.0–15.0)
Immature Granulocytes: 0 %
Lymphocytes Relative: 23 %
Lymphs Abs: 2 10*3/uL (ref 0.7–4.0)
MCH: 29.1 pg (ref 26.0–34.0)
MCHC: 33.2 g/dL (ref 30.0–36.0)
MCV: 87.8 fL (ref 80.0–100.0)
Monocytes Absolute: 0.3 10*3/uL (ref 0.1–1.0)
Monocytes Relative: 4 %
Neutro Abs: 6.2 10*3/uL (ref 1.7–7.7)
Neutrophils Relative %: 73 %
Platelets: 308 10*3/uL (ref 150–400)
RBC: 4.91 MIL/uL (ref 3.87–5.11)
RDW: 13.2 % (ref 11.5–15.5)
WBC: 8.6 10*3/uL (ref 4.0–10.5)
nRBC: 0 % (ref 0.0–0.2)

## 2022-06-16 LAB — URINALYSIS, ROUTINE W REFLEX MICROSCOPIC
Bilirubin Urine: NEGATIVE
Glucose, UA: NEGATIVE mg/dL
Ketones, ur: NEGATIVE mg/dL
Leukocytes,Ua: NEGATIVE
Nitrite: NEGATIVE
Protein, ur: NEGATIVE mg/dL
Specific Gravity, Urine: 1.01 (ref 1.005–1.030)
pH: 6.5 (ref 5.0–8.0)

## 2022-06-16 LAB — COMPREHENSIVE METABOLIC PANEL
ALT: 24 U/L (ref 0–44)
AST: 23 U/L (ref 15–41)
Albumin: 4.7 g/dL (ref 3.5–5.0)
Alkaline Phosphatase: 59 U/L (ref 38–126)
Anion gap: 5 (ref 5–15)
BUN: 13 mg/dL (ref 8–23)
CO2: 28 mmol/L (ref 22–32)
Calcium: 10.3 mg/dL (ref 8.9–10.3)
Chloride: 107 mmol/L (ref 98–111)
Creatinine, Ser: 0.79 mg/dL (ref 0.44–1.00)
GFR, Estimated: >60 mL/min (ref >60)
Glucose, Bld: 123 mg/dL — ABNORMAL HIGH (ref 70–99)
Potassium: 4.8 mmol/L (ref 3.5–5.1)
Sodium: 140 mmol/L (ref 135–145)
Total Bilirubin: 0.5 mg/dL (ref 0.3–1.2)
Total Protein: 8.5 g/dL — ABNORMAL HIGH (ref 6.5–8.1)

## 2022-06-16 LAB — URINALYSIS, MICROSCOPIC (REFLEX)

## 2022-06-16 LAB — GLUCOSE, CAPILLARY: Glucose-Capillary: 133 mg/dL — ABNORMAL HIGH (ref 70–99)

## 2022-06-16 MED ORDER — HYDROXYZINE HCL 25 MG PO TABS: ORAL_TABLET | ORAL | 0 refills | Status: AC

## 2022-06-16 NOTE — ED Provider Notes (Signed)
[facility]    CSN: [id number] Arrival date & time: 06/16/22  1410      History   Chief Complaint Chief Complaint  Patient presents with   Weakness   Anxiety    HPI Phyllis Knox is a 71 y.o. female presenting for onset of generalized fatigue/weakness, jitteriness/shakiness and feeling nervous and anxious today.  She says she has a lot of people coming into town for a party tomorrow.  She says they are all members of her family and she does not think that she make her nervous but she is not sure.  She reports that she has experienced similar symptoms to this in the past but they have normally been because she has been dehydrated or her electrolytes of been off.  She reports that she has tried to increase her fluid intake and electrolytes but that has not helped.  She says that she had difficulty sleeping last night due to feeling nervous and anxious.  She denies any chest pain, breathing difficulty, palpitations, numbness, headaches.  Reports that her stomach feels a little queasy but she has not had any vomiting.  Denies taking any routine medications.  Patient says she is very healthy.  She does have history of breast cancer in 2010.  HPI  Past Medical History:  Diagnosis Date   Cancer ([place]) 2010   Breast Cancer   Hyperlipidemia, mixed     Patient Active Problem List   Diagnosis Date Noted   Hyperlipidemia, mixed 08/26/2018   History of cancer of right breast 12/02/2017    Past Surgical History:  Procedure Laterality Date   BREAST LUMPECTOMY Right 2010   COLONOSCOPY  10/2011   Normal   TONSILLECTOMY      OB History   No obstetric history on file.      Home Medications    Prior to Admission medications   Medication Sig Start Date End Date Taking? Authorizing Provider  Ascorbic Acid (VITAMIN C) 100 MG tablet Take 1,000 mg by mouth daily.    Yes [provider]  Cholecalciferol (VITAMIN D-3) 1000 units CAPS Take by mouth.   Yes  [provider]  hydrOXYzine (ATARAX) 25 MG tablet Take 1/2 to 1 tablet every 8 hours as needed anxiety 06/16/22  Yes Danton Clap, PA-C  Multiple Minerals-Vitamins (CAL-MAG ZINC III PO) Take 1,000 mg by mouth. '100mg'$ -400-15 mg   Yes [provider]  Psyllium ([employer] NATURAL DAILY FIBER PO) Take by mouth.    [provider]  Turmeric 500 MG TABS Take by mouth.    [provider]    Family History Family History  Problem Relation Age of Onset   Breast cancer Mother    Prostate cancer Father     Social History Social History   Tobacco Use   Smoking status: Never   Smokeless tobacco: Never   Tobacco comments:    smoking cessation materials not required  Vaping Use   Vaping Use: Never used  Substance Use Topics   Alcohol use: No   Drug use: No     Allergies   Patient has no known allergies.   Review of Systems Review of Systems  Constitutional:  Positive for fatigue. Negative for fever.  Respiratory:  Negative for chest tightness and shortness of breath.   Cardiovascular:  Negative for chest pain and palpitations.  Gastrointestinal:  Positive for nausea. Negative for abdominal pain and vomiting.  Musculoskeletal:  Negative for arthralgias and myalgias.  Neurological:  Negative  for dizziness, numbness and headaches.  Psychiatric/Behavioral:  The patient is nervous/anxious.      Physical Exam Triage Vital Signs ED Triage Vitals  Enc Vitals Group     BP 06/16/22 1437 137/88     Pulse Rate 06/16/22 1437 93     Resp 06/16/22 1437 14     Temp 06/16/22 1437 99.3 F (37.4 C)     Temp Source 06/16/22 1437 Oral     SpO2 06/16/22 1437 96 %     Weight 06/16/22 1435 188 lb 4.4 oz (85.4 kg)     Height 06/16/22 1435 '5\' 8"'$  (1.727 m)     Head Circumference --      Peak Flow --      Pain Score 06/16/22 1435 0     Pain Loc --      Pain Edu? --      Excl. in [place]? --    No data found.  Updated Vital Signs BP 137/88 (BP Location: Left Arm)    Pulse 93   Temp 99.3 F (37.4 C) (Oral)   Resp 14   Ht '5\' 8"'$  (1.727 m)   Wt 188 lb 4.4 oz (85.4 kg)   SpO2 96%   BMI 28.63 kg/m      Physical Exam Vitals and nursing note reviewed.  Constitutional:      General: She is not in acute distress.    Appearance: Normal appearance. She is not ill-appearing or toxic-appearing.  HENT:     Head: Normocephalic and atraumatic.     Nose: Nose normal.     Mouth/Throat:     Mouth: Mucous membranes are moist.     Pharynx: Oropharynx is clear.  Eyes:     General: No scleral icterus.       Right eye: No discharge.        Left eye: No discharge.     Conjunctiva/sclera: Conjunctivae normal.  Cardiovascular:     Rate and Rhythm: Normal rate and regular rhythm.     Heart sounds: Normal heart sounds.  Pulmonary:     Effort: Pulmonary effort is normal. No respiratory distress.     Breath sounds: Normal breath sounds.  Abdominal:     Palpations: Abdomen is soft.     Tenderness: There is no abdominal tenderness.  Musculoskeletal:     Cervical back: Neck supple.  Skin:    General: Skin is dry.  Neurological:     General: No focal deficit present.     Mental Status: She is alert. Mental status is at baseline.     Motor: No weakness.     Gait: Gait normal.  Psychiatric:        Mood and Affect: Mood normal.        Behavior: Behavior normal.        Thought Content: Thought content normal.      UC Treatments / Results  Labs (all labs ordered are listed, but only abnormal results are displayed) Labs Reviewed  GLUCOSE, CAPILLARY - Abnormal; Notable for the following components:      Result Value   Glucose-Capillary 133 (*)    All other components within normal limits  COMPREHENSIVE METABOLIC PANEL - Abnormal; Notable for the following components:   Glucose, Bld 123 (*)    Total Protein 8.5 (*)    All other components within normal limits  URINALYSIS, ROUTINE W REFLEX MICROSCOPIC - Abnormal; Notable for the following components:    Hgb urine dipstick TRACE (*)  All other components within normal limits  URINALYSIS, MICROSCOPIC (REFLEX) - Abnormal; Notable for the following components:   Bacteria, UA FEW (*)    All other components within normal limits  CBC WITH DIFFERENTIAL/PLATELET  CBG MONITORING, ED    EKG   Radiology No results found.  Procedures ED EKG  Date/Time: 06/16/2022 3:22 PM  Performed by: Danton Clap, PA-C Authorized by: Danton Clap, PA-C   ECG reviewed by ED Physician in the absence of a cardiologist: yes   Previous ECG:    Previous ECG:  Unavailable Interpretation:    Interpretation: normal   Rate:    ECG rate:  80   ECG rate assessment: normal   Rhythm:    Rhythm: sinus rhythm   Ectopy:    Ectopy: none   QRS:    QRS axis:  Normal   QRS intervals:  Normal   QRS conduction: normal   ST segments:    ST segments:  Normal T waves:    T waves: normal   Comments:     Normal sinus rhythm. Regular rate  (including critical care time)  Medications Ordered in UC Medications - No data to display  Initial Impression / Assessment and Plan / UC Course  I have reviewed the triage vital signs and the nursing notes.  Pertinent labs & imaging results that were available during my care of the patient were reviewed by me and considered in my medical decision making (see chart for details).   70 year old female presenting for generalized fatigue, nausea and feeling jittery/anxious since yesterday.  She denies any other symptoms.  Has not been ill.  Denies chest pain, breathing difficulty, palpitations.  No abdominal pain, vomiting.  Reports that she does have an event planned tomorrow and a lot of family coming into town.  She says she supposes that could make her anxious but she does not think it normally would.  Vitals are stable and she is overall well-appearing.  Exam is benign today.  Chest clear to auscultation heart regular rate and rhythm.  Abdomen soft and  nontender.  Glucose is 133  EKG obtained as well as CBC and CMP.  UA also ordered.  EKG is normal.  Urinalysis without signs of UTI or dehydration. CBC Normal. CMP without any electrolyte abnormalities.   Discussed all results with patient.  Suspect her symptoms are related to being significantly busy and a little anxious about her event tomorrow.  We will try her on hydroxyzine.  Also advised calming and supportive care techniques.  ER precautions given.   Final Clinical Impressions(s) / UC Diagnoses   Final diagnoses:  Other fatigue  Jittery  Anxiety     Discharge Instructions      -Your EKG is normal.  Your blood sugar is a tiny bit elevated but you are not fasting.  Urinalysis without signs of UTI or dehydration.  Your lab work is also very reassuring.  You do not have any electrolyte abnormalities.  Your liver and kidney function is normal.  Your white blood cell count is normal so no sign of infection and you are not anemic. - Your symptoms are likely related to how busy you have been and being a little subconsciously anxious about the event tomorrow. - I have sent hydroxyzine to see if that helps with your anxiety.  Try to focus on your breathing.  Try to do something relaxing. - Hope you have fun at the party tomorrow!     ED Prescriptions  Medication Sig Dispense Auth. Provider   hydrOXYzine (ATARAX) 25 MG tablet Take 1/2 to 1 tablet every 8 hours as needed anxiety 30 tablet Danton Clap, PA-C      PDMP not reviewed this encounter.   Danton Clap, PA-C 06/16/22 1558    Laurene Footman B, PA-C 06/20/22 [phone]

## 2022-06-16 NOTE — ED Triage Notes (Signed)
Patient c/o weakness, shakiness and feeling nervous that started yesterday.  Patient denies any pain.

## 2022-06-16 NOTE — Discharge Instructions (Addendum)
-  Your EKG is normal.  Your blood sugar is a tiny bit elevated but you are not fasting.  Urinalysis without signs of UTI or dehydration.  Your lab work is also very reassuring.  You do not have any electrolyte abnormalities.  Your liver and kidney function is normal.  Your white blood cell count is normal so no sign of infection and you are not anemic. - Your symptoms are likely related to how busy you have been and being a little subconsciously anxious about the event tomorrow. - I have sent hydroxyzine to see if that helps with your anxiety.  Try to focus on your breathing.  Try to do something relaxing. - Hope you have fun at the party tomorrow!

## 2022-06-28 DIAGNOSIS — E569 Vitamin deficiency, unspecified: Secondary | ICD-10-CM | POA: Diagnosis not present

## 2022-06-28 DIAGNOSIS — R739 Hyperglycemia, unspecified: Secondary | ICD-10-CM | POA: Diagnosis not present

## 2022-06-28 DIAGNOSIS — F419 Anxiety disorder, unspecified: Secondary | ICD-10-CM | POA: Diagnosis not present

## 2022-06-28 DIAGNOSIS — E7800 Pure hypercholesterolemia, unspecified: Secondary | ICD-10-CM | POA: Diagnosis not present

## 2022-08-15 DIAGNOSIS — H5310 Unspecified subjective visual disturbances: Secondary | ICD-10-CM | POA: Diagnosis not present

## 2023-03-21 DIAGNOSIS — H169 Unspecified keratitis: Secondary | ICD-10-CM | POA: Diagnosis not present

## 2023-03-29 DIAGNOSIS — H1011 Acute atopic conjunctivitis, right eye: Secondary | ICD-10-CM | POA: Diagnosis not present

## 2023-04-01 DIAGNOSIS — L03211 Cellulitis of face: Secondary | ICD-10-CM | POA: Diagnosis not present

## 2023-04-01 DIAGNOSIS — B301 Conjunctivitis due to adenovirus: Secondary | ICD-10-CM | POA: Diagnosis not present

## 2023-04-04 DIAGNOSIS — B301 Conjunctivitis due to adenovirus: Secondary | ICD-10-CM | POA: Diagnosis not present

## 2023-04-04 DIAGNOSIS — L03211 Cellulitis of face: Secondary | ICD-10-CM | POA: Diagnosis not present

## 2023-04-30 DIAGNOSIS — H5203 Hypermetropia, bilateral: Secondary | ICD-10-CM | POA: Diagnosis not present

## 2023-04-30 DIAGNOSIS — H52223 Regular astigmatism, bilateral: Secondary | ICD-10-CM | POA: Diagnosis not present

## 2023-04-30 DIAGNOSIS — H2513 Age-related nuclear cataract, bilateral: Secondary | ICD-10-CM | POA: Diagnosis not present

## 2023-04-30 DIAGNOSIS — D3132 Benign neoplasm of left choroid: Secondary | ICD-10-CM | POA: Diagnosis not present

## 2023-05-07 DIAGNOSIS — R300 Dysuria: Secondary | ICD-10-CM | POA: Diagnosis not present

## 2023-05-28 DIAGNOSIS — M1712 Unilateral primary osteoarthritis, left knee: Secondary | ICD-10-CM | POA: Diagnosis not present

## 2024-05-05 DIAGNOSIS — D3132 Benign neoplasm of left choroid: Secondary | ICD-10-CM | POA: Diagnosis not present

## 2024-05-05 DIAGNOSIS — H2513 Age-related nuclear cataract, bilateral: Secondary | ICD-10-CM | POA: Diagnosis not present

## 2024-05-19 DIAGNOSIS — Z0001 Encounter for general adult medical examination with abnormal findings: Secondary | ICD-10-CM | POA: Diagnosis not present

## 2024-05-25 DIAGNOSIS — M25562 Pain in left knee: Secondary | ICD-10-CM | POA: Diagnosis not present

## 2024-05-25 DIAGNOSIS — R2689 Other abnormalities of gait and mobility: Secondary | ICD-10-CM | POA: Diagnosis not present

## 2024-05-28 DIAGNOSIS — R2689 Other abnormalities of gait and mobility: Secondary | ICD-10-CM | POA: Diagnosis not present

## 2024-05-28 DIAGNOSIS — M25562 Pain in left knee: Secondary | ICD-10-CM | POA: Diagnosis not present

## 2024-06-01 DIAGNOSIS — R2689 Other abnormalities of gait and mobility: Secondary | ICD-10-CM | POA: Diagnosis not present

## 2024-06-01 DIAGNOSIS — M25562 Pain in left knee: Secondary | ICD-10-CM | POA: Diagnosis not present

## 2024-06-04 DIAGNOSIS — R2689 Other abnormalities of gait and mobility: Secondary | ICD-10-CM | POA: Diagnosis not present

## 2024-06-04 DIAGNOSIS — M25562 Pain in left knee: Secondary | ICD-10-CM | POA: Diagnosis not present

## 2024-06-08 DIAGNOSIS — R2689 Other abnormalities of gait and mobility: Secondary | ICD-10-CM | POA: Diagnosis not present

## 2024-06-08 DIAGNOSIS — M25562 Pain in left knee: Secondary | ICD-10-CM | POA: Diagnosis not present

## 2024-06-10 DIAGNOSIS — M25562 Pain in left knee: Secondary | ICD-10-CM | POA: Diagnosis not present

## 2024-06-10 DIAGNOSIS — R2689 Other abnormalities of gait and mobility: Secondary | ICD-10-CM | POA: Diagnosis not present

## 2024-06-15 DIAGNOSIS — R2689 Other abnormalities of gait and mobility: Secondary | ICD-10-CM | POA: Diagnosis not present

## 2024-06-15 DIAGNOSIS — M25562 Pain in left knee: Secondary | ICD-10-CM | POA: Diagnosis not present

## 2024-06-17 DIAGNOSIS — R2689 Other abnormalities of gait and mobility: Secondary | ICD-10-CM | POA: Diagnosis not present

## 2024-06-17 DIAGNOSIS — M25562 Pain in left knee: Secondary | ICD-10-CM | POA: Diagnosis not present

## 2024-06-22 DIAGNOSIS — M25562 Pain in left knee: Secondary | ICD-10-CM | POA: Diagnosis not present

## 2024-06-22 DIAGNOSIS — R2689 Other abnormalities of gait and mobility: Secondary | ICD-10-CM | POA: Diagnosis not present

## 2024-06-24 DIAGNOSIS — M25562 Pain in left knee: Secondary | ICD-10-CM | POA: Diagnosis not present

## 2024-06-24 DIAGNOSIS — R2689 Other abnormalities of gait and mobility: Secondary | ICD-10-CM | POA: Diagnosis not present
